# Patient Record
Sex: Male | Born: 1995 | Race: White | Hispanic: No | Marital: Single | Smoking: Former smoker
Health system: Southern US, Community
[De-identification: ages and names within clinical notes are randomized; demographics above are authoritative.]

## PROBLEM LIST (undated history)

## (undated) ENCOUNTER — Emergency Department (HOSPITAL_COMMUNITY): Disposition: A | Discharge status: Home / Self Care | Payer: Self-pay

## (undated) DIAGNOSIS — R42 Dizziness and giddiness: Secondary | ICD-10-CM

## (undated) HISTORY — DX: Dizziness and giddiness: R42

## (undated) HISTORY — PX: NO PAST SURGERIES: SHX2092

---
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
## 2013-07-13 ENCOUNTER — Ambulatory Visit (INDEPENDENT_AMBULATORY_CARE_PROVIDER_SITE_OTHER): Payer: BC Managed Care – PPO | Admitting: Physician Assistant

## 2013-07-13 VITALS — BP 100/70 | HR 77 | Temp 97.7°F | Resp 16 | Ht 68.0 in | Wt 154.0 lb

## 2013-07-13 DIAGNOSIS — Z1329 Encounter for screening for other suspected endocrine disorder: Secondary | ICD-10-CM

## 2013-07-13 DIAGNOSIS — Z1321 Encounter for screening for nutritional disorder: Secondary | ICD-10-CM

## 2013-07-13 DIAGNOSIS — Z130 Encounter for screening for diseases of the blood and blood-forming organs and certain disorders involving the immune mechanism: Secondary | ICD-10-CM

## 2013-07-13 DIAGNOSIS — Z8342 Family history of familial hypercholesterolemia: Secondary | ICD-10-CM

## 2013-07-13 DIAGNOSIS — Z13228 Encounter for screening for other metabolic disorders: Secondary | ICD-10-CM

## 2013-07-13 DIAGNOSIS — Z13220 Encounter for screening for lipoid disorders: Secondary | ICD-10-CM

## 2013-07-13 DIAGNOSIS — Z8349 Family history of other endocrine, nutritional and metabolic diseases: Secondary | ICD-10-CM

## 2013-07-13 LAB — POCT CBC
GRANULOCYTE PERCENT: 51.6 % (ref 37–80)
HEMATOCRIT: 50.1 % (ref 43.5–53.7)
HEMOGLOBIN: 16.4 g/dL (ref 14.1–18.1)
LYMPH, POC: 1.9 (ref 0.6–3.4)
MCH, POC: 30.5 pg (ref 27–31.2)
MCHC: 32.7 g/dL (ref 31.8–35.4)
MCV: 93.3 fL (ref 80–97)
MID (cbc): 0.5 (ref 0–0.9)
MPV: 12.7 fL (ref 0–99.8)
POC GRANULOCYTE: 2.5 (ref 2–6.9)
POC LYMPH %: 38.7 % (ref 10–50)
POC MID %: 9.7 %M (ref 0–12)
Platelet Count, POC: 230 10*3/uL (ref 142–424)
RBC: 5.37 M/uL (ref 4.69–6.13)
RDW, POC: 13.1 %
WBC: 4.9 10*3/uL (ref 4.6–10.2)

## 2013-07-13 LAB — COMPLETE METABOLIC PANEL WITH GFR
ALBUMIN: 4.6 g/dL (ref 3.5–5.2)
ALT: 19 U/L (ref 0–53)
AST: 20 U/L (ref 0–37)
Alkaline Phosphatase: 55 U/L (ref 39–117)
BUN: 11 mg/dL (ref 6–23)
CO2: 27 meq/L (ref 19–32)
Calcium: 9.5 mg/dL (ref 8.4–10.5)
Chloride: 101 mEq/L (ref 96–112)
Creat: 0.8 mg/dL (ref 0.50–1.35)
GLUCOSE: 83 mg/dL (ref 70–99)
POTASSIUM: 4.8 meq/L (ref 3.5–5.3)
SODIUM: 138 meq/L (ref 135–145)
TOTAL PROTEIN: 7.4 g/dL (ref 6.0–8.3)
Total Bilirubin: 0.5 mg/dL (ref 0.2–1.2)

## 2013-07-13 LAB — LIPID PANEL
CHOLESTEROL: 156 mg/dL (ref 0–200)
HDL: 56 mg/dL (ref >39)
LDL Cholesterol: 83 mg/dL (ref 0–99)
Total CHOL/HDL Ratio: 2.8 Ratio
Triglycerides: 85 mg/dL (ref <150)
VLDL: 17 mg/dL (ref 0–40)

## 2013-07-13 LAB — TSH: TSH: 1.339 u[IU]/mL (ref 0.350–4.500)

## 2013-07-13 NOTE — Patient Instructions (Signed)
I will contact you with your lab results as soon as they are available.   If you have not heard from me in 2 weeks, please contact me.  The fastest way to get your results is to register for My Chart (see the instructions on the last page of this printout).   

## 2013-07-13 NOTE — Progress Notes (Signed)
   Subjective:    Patient ID: Johnny Hall, male    DOB: October 18, 1995, 18 y.o.   MRN: [id number]  HPI  Pt presents to clinic for lab work.  He has no concerns but he is home from break from school and his mother would like some screening lab work.  He has always been healthy to the best of his knowledge.  He thinks he might have had some anemia in the past.  Review of Systems  Constitutional: Negative.   HENT: Negative.        Objective:   Physical Exam  Vitals reviewed. Constitutional: He is oriented to person, place, and time. He appears well-developed and well-nourished.  HENT:  Head: Normocephalic and atraumatic.  Right Ear: External ear normal.  Left Ear: External ear normal.  Eyes: Conjunctivae are normal.  Neck: Normal range of motion. No mass and no thyromegaly present.  Cardiovascular: Normal rate, regular rhythm and normal heart sounds.   No murmur heard. Pulmonary/Chest: Effort normal and breath sounds normal. He has no wheezes.  Neurological: He is alert and oriented to person, place, and time.  Skin: Skin is warm and dry.  Psychiatric: He has a normal mood and affect. His behavior is normal. Judgment and thought content normal.   Results for orders placed in visit on 09/09/11  POCT CBC      Result Value Ref Range   WBC 4.6  4.6 - 10.2 K/uL   Lymph, poc 1.7  0.6 - 3.4   POC LYMPH PERCENT 36.9  10 - 50 %L   MID (cbc) 0.6  0 - 0.9   POC MID % 12.4 (*) 0 - 12 %M   POC Granulocyte 2.3  2 - 6.9   Granulocyte percent 50.7  37 - 80 %G   RBC 4.78  4.69 - 6.13 M/uL   Hemoglobin 14.4  14.1 - 18.1 g/dL   HCT, POC 43.5  43.5 - 53.7 %   MCV 91.0  80 - 97 fL   MCH, POC 30.1  27 - 31.2 pg   MCHC 33.1  31.8 - 35.4 g/dL   RDW, POC 12.7     Platelet Count, POC 206  142 - 424 K/uL   MPV 10.6  0 - 99.8 fL       Assessment & Plan:  Screening for thyroid disorder - Plan: TSH  Screening cholesterol level - Plan: COMPLETE METABOLIC PANEL WITH GFR, Lipid panel  Family  history of high cholesterol  Encounter for vitamin deficiency screening - Plan: Vit D  25 hydroxy (rtn osteoporosis monitoring)  Screening for blood disease - Plan: POCT CBC  We will screen for general labs.   Windell Hummingbird PA-C  [facility] 07/13/2013 10:47 AM

## 2013-07-14 LAB — VITAMIN D 25 HYDROXY (VIT D DEFICIENCY, FRACTURES): VIT D 25 HYDROXY: 21 ng/mL — AB (ref 30–89)

## 2014-06-01 ENCOUNTER — Ambulatory Visit (INDEPENDENT_AMBULATORY_CARE_PROVIDER_SITE_OTHER): Payer: BLUE CROSS/BLUE SHIELD | Admitting: Internal Medicine

## 2014-06-01 ENCOUNTER — Emergency Department (HOSPITAL_COMMUNITY): Payer: BLUE CROSS/BLUE SHIELD

## 2014-06-01 ENCOUNTER — Emergency Department (HOSPITAL_COMMUNITY)
Admission: EM | Admit: 2014-06-01 | Discharge: 2014-06-01 | Disposition: A | Discharge status: Home / Self Care | Payer: BLUE CROSS/BLUE SHIELD | Attending: Emergency Medicine | Admitting: Emergency Medicine

## 2014-06-01 VITALS — BP 119/68 | HR 108 | Temp 99.4°F | Resp 18

## 2014-06-01 DIAGNOSIS — K529 Noninfective gastroenteritis and colitis, unspecified: Secondary | ICD-10-CM | POA: Insufficient documentation

## 2014-06-01 DIAGNOSIS — E860 Dehydration: Secondary | ICD-10-CM | POA: Diagnosis not present

## 2014-06-01 DIAGNOSIS — R1084 Generalized abdominal pain: Secondary | ICD-10-CM | POA: Diagnosis not present

## 2014-06-01 DIAGNOSIS — R197 Diarrhea, unspecified: Secondary | ICD-10-CM

## 2014-06-01 DIAGNOSIS — R000 Tachycardia, unspecified: Secondary | ICD-10-CM | POA: Diagnosis not present

## 2014-06-01 DIAGNOSIS — R112 Nausea with vomiting, unspecified: Secondary | ICD-10-CM

## 2014-06-01 DIAGNOSIS — R55 Syncope and collapse: Secondary | ICD-10-CM | POA: Diagnosis not present

## 2014-06-01 DIAGNOSIS — R42 Dizziness and giddiness: Secondary | ICD-10-CM | POA: Diagnosis not present

## 2014-06-01 LAB — CBC WITH DIFFERENTIAL/PLATELET
Basophils Absolute: 0 10*3/uL (ref 0.0–0.1)
Basophils Relative: 0 % (ref 0–1)
Eosinophils Absolute: 0 10*3/uL (ref 0.0–0.7)
Eosinophils Relative: 0 % (ref 0–5)
HCT: 39.9 % (ref 39.0–52.0)
Hemoglobin: 13.3 g/dL (ref 13.0–17.0)
Lymphocytes Relative: 5 % — ABNORMAL LOW (ref 12–46)
Lymphs Abs: 0.5 10*3/uL — ABNORMAL LOW (ref 0.7–4.0)
MCH: 30.5 pg (ref 26.0–34.0)
MCHC: 33.3 g/dL (ref 30.0–36.0)
MCV: 91.5 fL (ref 78.0–100.0)
Monocytes Absolute: 0.7 10*3/uL (ref 0.1–1.0)
Monocytes Relative: 7 % (ref 3–12)
Neutro Abs: 9.1 10*3/uL — ABNORMAL HIGH (ref 1.7–7.7)
Neutrophils Relative %: 88 % — ABNORMAL HIGH (ref 43–77)
Platelets: 163 10*3/uL (ref 150–400)
RBC: 4.36 MIL/uL (ref 4.22–5.81)
RDW: 12.1 % (ref 11.5–15.5)
WBC: 10.3 10*3/uL (ref 4.0–10.5)

## 2014-06-01 LAB — POCT URINALYSIS DIPSTICK
BILIRUBIN UA: NEGATIVE
Glucose, UA: NEGATIVE
Ketones, UA: NEGATIVE
LEUKOCYTES UA: NEGATIVE
NITRITE UA: NEGATIVE
Protein, UA: NEGATIVE
SPEC GRAV UA: 1.02
Urobilinogen, UA: 0.2
pH, UA: 6.5

## 2014-06-01 LAB — POCT CBC
Granulocyte percent: 94.1 %G — AB (ref 37–80)
Granulocyte percent: 93.1 %G — AB (ref 37–80)
HCT, POC: 48.5 % (ref 43.5–53.7)
HEMATOCRIT: 51.8 % (ref 43.5–53.7)
HEMOGLOBIN: 17.3 g/dL (ref 14.1–18.1)
HEMOGLOBIN: 15.8 g/dL (ref 14.1–18.1)
LYMPH, POC: 0.7 (ref 0.6–3.4)
Lymph, poc: 0.7 (ref 0.6–3.4)
MCH, POC: 30.4 pg (ref 27–31.2)
MCH: 29.8 pg (ref 27–31.2)
MCHC: 33.3 g/dL (ref 31.8–35.4)
MCHC: 32.6 g/dL (ref 31.8–35.4)
MCV: 91.2 fL (ref 80–97)
MCV: 91.6 fL (ref 80–97)
MID (cbc): 0.2 (ref 0–0.9)
MID (cbc): 0.4 (ref 0–0.9)
MPV: 9.6 fL (ref 0–99.8)
MPV: 8.9 fL (ref 0–99.8)
POC GRANULOCYTE: 14.7 — AB (ref 2–6.9)
POC GRANULOCYTE: 14.9 — AB (ref 2–6.9)
POC LYMPH %: 4.7 % — AB (ref 10–50)
POC LYMPH %: 4.3 % — AB (ref 10–50)
POC MID %: 1.2 %M (ref 0–12)
POC MID %: 2.6 %M (ref 0–12)
Platelet Count, POC: 174 10*3/uL (ref 142–424)
Platelet Count, POC: 175 10*3/uL (ref 142–424)
RBC: 5.68 M/uL (ref 4.69–6.13)
RBC: 5.29 M/uL (ref 4.69–6.13)
RDW, POC: 13 %
RDW, POC: 12.6 %
WBC: 15.6 10*3/uL — AB (ref 4.6–10.2)
WBC: 16 10*3/uL — AB (ref 4.6–10.2)

## 2014-06-01 LAB — POCT UA - MICROSCOPIC ONLY
Casts, Ur, LPF, POC: NEGATIVE
Crystals, Ur, HPF, POC: NEGATIVE
MUCUS UA: NEGATIVE
RBC, urine, microscopic: NEGATIVE
WBC, Ur, HPF, POC: NEGATIVE
Yeast, UA: NEGATIVE

## 2014-06-01 LAB — COMPREHENSIVE METABOLIC PANEL
ALK PHOS: 59 U/L (ref 39–117)
ALT: 13 U/L (ref 0–53)
ALT: 8 U/L (ref 0–53)
AST: 16 U/L (ref 0–37)
AST: 26 U/L (ref 0–37)
Albumin: 5.1 g/dL (ref 3.5–5.2)
Albumin: 4 g/dL (ref 3.5–5.2)
Alkaline Phosphatase: 42 U/L (ref 39–117)
Anion gap: 8 (ref 5–15)
BUN: 12 mg/dL (ref 6–23)
BUN: 11 mg/dL (ref 6–23)
CHLORIDE: 100 meq/L (ref 96–112)
CO2: 25 mEq/L (ref 19–32)
CO2: 23 mmol/L (ref 19–32)
Calcium: 10.3 mg/dL (ref 8.4–10.5)
Calcium: 8.1 mg/dL — ABNORMAL LOW (ref 8.4–10.5)
Chloride: 110 mmol/L (ref 96–112)
Creat: 1.07 mg/dL (ref 0.50–1.35)
Creatinine, Ser: 0.94 mg/dL (ref 0.50–1.35)
GFR calc Af Amer: >90 mL/min (ref >90)
GFR calc non Af Amer: >90 mL/min (ref >90)
Glucose, Bld: 105 mg/dL — ABNORMAL HIGH (ref 70–99)
Glucose, Bld: 96 mg/dL (ref 70–99)
Potassium: 5.1 mEq/L (ref 3.5–5.3)
Potassium: 4 mmol/L (ref 3.5–5.1)
Sodium: 138 mEq/L (ref 135–145)
Sodium: 141 mmol/L (ref 135–145)
TOTAL PROTEIN: 7.9 g/dL (ref 6.0–8.3)
Total Bilirubin: 1.2 mg/dL (ref 0.2–1.2)
Total Bilirubin: 1.1 mg/dL (ref 0.3–1.2)
Total Protein: 6.5 g/dL (ref 6.0–8.3)

## 2014-06-01 LAB — I-STAT CG4 LACTIC ACID, ED
Lactic Acid, Venous: 1.93 mmol/L (ref 0.5–2.0)
Lactic Acid, Venous: 0.84 mmol/L (ref 0.5–2.0)

## 2014-06-01 LAB — GLUCOSE, POCT (MANUAL RESULT ENTRY): POC GLUCOSE: 102 mg/dL — AB (ref 70–99)

## 2014-06-01 LAB — LIPASE, BLOOD: Lipase: 25 U/L (ref 11–59)

## 2014-06-01 MED ORDER — IOHEXOL 300 MG/ML  SOLN
100.0000 mL | Freq: Once | INTRAMUSCULAR | Status: AC | PRN
  Administered 2014-06-01: 100 mL via INTRAVENOUS

## 2014-06-01 MED ORDER — SODIUM CHLORIDE 0.9 % IV BOLUS (SEPSIS)
1000.0000 mL | INTRAVENOUS | Status: AC
  Administered 2014-06-01: 1000 mL via INTRAVENOUS

## 2014-06-01 MED ORDER — ONDANSETRON HCL 4 MG/2ML IJ SOLN
4.0000 mg | INTRAMUSCULAR | Status: AC
  Administered 2014-06-01: 4 mg via INTRAVENOUS
  Filled 2014-06-01: qty 2

## 2014-06-01 MED ORDER — ONDANSETRON 4 MG PO TBDP
8.0000 mg | ORAL_TABLET | Freq: Once | ORAL | Status: AC
  Administered 2014-06-01: 8 mg via ORAL

## 2014-06-01 MED ORDER — CEFTRIAXONE SODIUM 1 G IJ SOLR
1.0000 g | Freq: Once | INTRAMUSCULAR | Status: AC
  Administered 2014-06-01: 1 g via INTRAMUSCULAR

## 2014-06-01 MED ORDER — SODIUM CHLORIDE 0.9 % IV BOLUS (SEPSIS)
1000.0000 mL | Freq: Once | INTRAVENOUS | Status: AC
  Administered 2014-06-01: 1000 mL via INTRAVENOUS

## 2014-06-01 MED ORDER — ONDANSETRON HCL 4 MG PO TABS: 4.0000 mg | ORAL_TABLET | Freq: Four times a day (QID) | ORAL | Status: DC

## 2014-06-01 MED ORDER — KETOROLAC TROMETHAMINE 30 MG/ML IJ SOLN
30.0000 mg | Freq: Once | INTRAMUSCULAR | Status: AC
  Administered 2014-06-01: 30 mg via INTRAVENOUS
  Filled 2014-06-01: qty 1

## 2014-06-01 MED ORDER — IOHEXOL 300 MG/ML  SOLN
25.0000 mL | Freq: Once | INTRAMUSCULAR | Status: AC | PRN
  Administered 2014-06-01: 25 mL via ORAL

## 2014-06-01 NOTE — ED Provider Notes (Signed)
CSN: [id number]     Arrival date & time 06/01/14  1622 History   First MD Initiated Contact with Patient 06/01/14 1708     Chief Complaint  Patient presents with  . infection/ sent from urgent care    (Consider location/radiation/quality/duration/timing/severity/associated sxs/prior Treatment) HPI Johnny Hall is a 19 yo male presenting with report of diarrhea and vomiting.  He states he woke up this morning with and had multiple episodes of diarrhea.  He attempted to drink some hot tea and then he felt nauseated and vomited.  He reports 15-20 episodes of diarrhea and multiple episodes of vomiting.  He went to the Urgent Care and had a fever and was given 3 liters of NS, IM rocephin and zofran.  He currently complains of general muscle aches.  He denies testicular pain. He denies bloody or bilious emesis or dark or bloody stools.     No past medical history on file. No past surgical history on file. Family History  Problem Relation Age of Onset  . Hyperlipidemia Mother   . Hypertension Mother    History  Substance Use Topics  . Smoking status: Former Research scientist (life sciences)  . Smokeless tobacco: Not on file  . Alcohol Use: No    Review of Systems  Constitutional: Positive for fever. Negative for chills.  HENT: Negative for sore throat.   Eyes: Negative for visual disturbance.  Respiratory: Negative for cough and shortness of breath.   Cardiovascular: Negative for chest pain and leg swelling.  Gastrointestinal: Positive for nausea, vomiting, abdominal pain and diarrhea.  Genitourinary: Negative for dysuria.  Musculoskeletal: Negative for myalgias.  Skin: Negative for rash.  Neurological: Negative for weakness, numbness and headaches.      Allergies  Review of patient's allergies indicates no known allergies.  Home Medications   Prior to Admission medications   Medication Sig Start Date End Date Taking? Authorizing Provider  acetaminophen (TYLENOL) 500 MG tablet Take 1,000 mg by mouth  every 6 (six) hours as needed for moderate pain.   Yes Historical Provider, MD  acetaminophen-codeine 120-12 MG/5ML solution Take 5 mLs by mouth every 4 (four) hours as needed for moderate pain.   Yes Historical Provider, MD  cefTRIAXone (ROCEPHIN) 1 G injection Inject 1 g into the muscle once.   Yes Historical Provider, MD  ondansetron (ZOFRAN-ODT) 8 MG disintegrating tablet Take 16 mg by mouth every 8 (eight) hours as needed for nausea or vomiting.   Yes Historical Provider, MD   BP 115/57 mmHg  Pulse 107  Temp(Src) 98.4 F (36.9 C) (Oral)  Resp 16  SpO2 97% Physical Exam  Constitutional: He is oriented to person, place, and time. He appears well-developed and well-nourished. No distress.  HENT:  Head: Normocephalic and atraumatic.  Mouth/Throat: Oropharynx is clear and moist. No oropharyngeal exudate.  Eyes: Conjunctivae are normal.  Neck: Neck supple. No thyromegaly present.  Cardiovascular: Regular rhythm and intact distal pulses.  Tachycardia present.   Pulmonary/Chest: Effort normal and breath sounds normal. No respiratory distress. He has no wheezes. He has no rales. He exhibits no tenderness.  Abdominal: Soft. He exhibits no distension and no mass. There is no hepatosplenomegaly. There is generalized tenderness. There is no rigidity, no rebound, no guarding, no CVA tenderness, no tenderness at McBurney's point and negative Murphy's sign.    Generally tender, slightly more TTP over LLQ  Musculoskeletal: He exhibits no tenderness.  Lymphadenopathy:    He has no cervical adenopathy.  Neurological: He is alert and oriented to person,  place, and time.  Skin: Skin is warm. No rash noted. He is diaphoretic.  Psychiatric: He has a normal mood and affect.  Nursing note and vitals reviewed.   ED Course  Procedures (including critical care time) Labs Review Labs Reviewed  COMPREHENSIVE METABOLIC PANEL - Abnormal; Notable for the following:    Calcium 8.1 (*)    All other  components within normal limits  CBC WITH DIFFERENTIAL/PLATELET - Abnormal; Notable for the following:    Neutrophils Relative % 88 (*)    Neutro Abs 9.1 (*)    Lymphocytes Relative 5 (*)    Lymphs Abs 0.5 (*)    All other components within normal limits  LIPASE, BLOOD  CBC WITH DIFFERENTIAL/PLATELET  I-STAT CG4 LACTIC ACID, ED  I-STAT CG4 LACTIC ACID, ED  I-STAT CG4 LACTIC ACID, ED    Imaging Review Ct Abdomen Pelvis W Contrast  06/01/2014   CLINICAL DATA:  Lower abdominal pain for 2 days. Nausea and vomiting. Low-grade fever.  EXAM: CT ABDOMEN AND PELVIS WITH CONTRAST  TECHNIQUE: Multidetector CT imaging of the abdomen and pelvis was performed using the standard protocol following bolus administration of intravenous contrast.  CONTRAST:  66mL OMNIPAQUE IOHEXOL 300 MG/ML SOLN, 140mL OMNIPAQUE IOHEXOL 300 MG/ML SOLN  COMPARISON:  None.  FINDINGS: Lower chest:  Unremarkable  Hepatobiliary: Unremarkable  Pancreas: Unremarkable  Spleen: Unremarkable  Adrenals/Urinary Tract: Unremarkable  Stomach/Bowel: Air-fluid levels in the distal colon and rectum. No dilated bowel. Appendix unremarkable.  Vascular/Lymphatic: Unremarkable  Reproductive: Unremarkable  Other: No supplemental non-categorized findings.  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 Air-fluid levels in the distal colon and rectum favoring diarrheal process.   Electronically Signed   By: Van Clines M.D.   On: 06/01/2014 22:31     EKG Interpretation None      MDM   Final diagnoses:  Gastroenteritis    19 yo sent from Urgent Care for further eval of n/v/d. He had elevated WBC to 16. Pt received 3 L NS and rocephin prior to arrival. On arrival to the ED he remains tachycardic to 120 with general abd pain, slightly more tender on LLQ. Consider viral gastroenteritis vs colitis. NS bolus, zofran, toradol,  Labs drawn from triage include:CBC, CMP, Lipase, Lactic acid.  Discussed  case with Dr. Alvino Chapel. Pt remains tachycardic to 115 after 2 liters and on repeat abd exam still generally tender slightly more tender in LLQ. CT abd/pelvis done due to clinical appearance and family concern. CT result unremarkable for significant abnormality. Pt reports feeling better after and can drink fluids without difficulty. Discussed symptom management and oral re-hydration. Pt is well-appearing, in no acute distress and vital signs reviewed and not concerning. He appears safe to be discharged.  Discharge include follow-up with his PCP.  Return precautions provided. Pt and family aware of plan and in agreement.     Filed Vitals:   06/01/14 1905 06/01/14 2109 06/01/14 2234 06/01/14 2320  BP: 115/57 114/60 124/69   Pulse: 107 104 105   Temp: 98.4 F (36.9 C)   99.3 F (37.4 C)  TempSrc: Oral   Oral  Resp: 16 12 17    SpO2: 97% 97% 98%    Meds given in ED:  Medications  sodium chloride 0.9 % bolus 1,000 mL (0 mLs Intravenous Stopped 06/01/14 1945)  sodium chloride 0.9 % bolus 1,000 mL (0 mLs Intravenous Stopped 06/01/14 2041)  ketorolac (TORADOL) 30 MG/ML injection 30 mg (30 mg Intravenous Given  06/01/14 1946)  ondansetron (ZOFRAN) injection 4 mg (4 mg Intravenous Given 06/01/14 1946)  iohexol (OMNIPAQUE) 300 MG/ML solution 100 mL (100 mLs Intravenous Contrast Given 06/01/14 2209)  iohexol (OMNIPAQUE) 300 MG/ML solution 25 mL (25 mLs Oral Contrast Given 06/01/14 2209)  ondansetron (ZOFRAN) injection 4 mg (4 mg Intravenous Given 06/01/14 2313)    Discharge Medication List as of 06/01/2014 11:25 PM     06/01/14 0000  ondansetron (ZOFRAN) 4 MG tablet Every 6 hours Discontinue Reprint 06/01/14 2327          Britt Bottom, NP 06/02/14 [place]. Alvino Chapel, MD 06/02/14 1446

## 2014-06-01 NOTE — Progress Notes (Signed)
2 liters NS IV completed and 3rd liter started at 3 pm.

## 2014-06-01 NOTE — ED Notes (Signed)
Per ems pt from [facility], pt was seen for N/V/ severe diarrhea and fever. Pt received Rocephin 1 g IM , and zofran SL. Also received tylenol 3 IV .

## 2014-06-01 NOTE — Patient Instructions (Addendum)
Food Choices to Help Relieve Diarrhea When you have diarrhea, the foods you eat and your eating habits are very important. Choosing the right foods and drinks can help relieve diarrhea. Also, because diarrhea can last up to 7 days, you need to replace lost fluids and electrolytes (such as sodium, potassium, and chloride) in order to help prevent dehydration.  WHAT GENERAL GUIDELINES DO I NEED TO FOLLOW?  Slowly drink 1 cup (8 oz) of fluid for each episode of diarrhea. If you are getting enough fluid, your urine will be clear or pale yellow.  Eat starchy foods. Some good choices include white rice, white toast, pasta, low-fiber cereal, baked potatoes (without the skin), saltine crackers, and bagels.  Avoid large servings of any cooked vegetables.  Limit fruit to two servings per day. A serving is  cup or 1 small piece.  Choose foods with less than 2 g of fiber per serving.  Limit fats to less than 8 tsp (38 g) per day.  Avoid fried foods.  Eat foods that have probiotics in them. Probiotics can be found in certain dairy products.  Avoid foods and beverages that may increase the speed at which food moves through the stomach and intestines (gastrointestinal tract). Things to avoid include:  High-fiber foods, such as dried fruit, raw fruits and vegetables, nuts, seeds, and whole grain foods.  Spicy foods and high-fat foods.  Foods and beverages sweetened with high-fructose corn syrup, honey, or sugar alcohols such as xylitol, sorbitol, and mannitol. WHAT FOODS ARE RECOMMENDED? Grains White rice. White, French, or pita breads (fresh or toasted), including plain rolls, buns, or bagels. White pasta. Saltine, soda, or graham crackers. Pretzels. Low-fiber cereal. Cooked cereals made with water (such as cornmeal, farina, or cream cereals). Plain muffins. Matzo. Melba toast. Zwieback.  Vegetables Potatoes (without the skin). Strained tomato and vegetable juices. Most well-cooked and canned  vegetables without seeds. Tender lettuce. Fruits Cooked or canned applesauce, apricots, cherries, fruit cocktail, grapefruit, peaches, pears, or plums. Fresh bananas, apples without skin, cherries, grapes, cantaloupe, grapefruit, peaches, oranges, or plums.  Meat and Other Protein Products Baked or boiled chicken. Eggs. Tofu. Fish. Seafood. Smooth peanut butter. Ground or well-cooked tender beef, ham, veal, lamb, pork, or poultry.  Dairy Plain yogurt, kefir, and unsweetened liquid yogurt. Lactose-free milk, buttermilk, or soy milk. Plain hard cheese. Beverages Sport drinks. Clear broths. Diluted fruit juices (except prune). Regular, caffeine-free sodas such as ginger ale. Water. Decaffeinated teas. Oral rehydration solutions. Sugar-free beverages not sweetened with sugar alcohols. Other Bouillon, broth, or soups made from recommended foods.  The items listed above may not be a complete list of recommended foods or beverages. Contact your dietitian for more options. WHAT FOODS ARE NOT RECOMMENDED? Grains Whole grain, whole wheat, bran, or rye breads, rolls, pastas, crackers, and cereals. Wild or brown rice. Cereals that contain more than 2 g of fiber per serving. Corn tortillas or taco shells. Cooked or dry oatmeal. Granola. Popcorn. Vegetables Raw vegetables. Cabbage, broccoli, Brussels sprouts, artichokes, baked beans, beet greens, corn, kale, legumes, peas, sweet potatoes, and yams. Potato skins. Cooked spinach and cabbage. Fruits Dried fruit, including raisins and dates. Raw fruits. Stewed or dried prunes. Fresh apples with skin, apricots, mangoes, pears, raspberries, and strawberries.  Meat and Other Protein Products Chunky peanut butter. Nuts and seeds. Beans and lentils. Bacon.  Dairy High-fat cheeses. Milk, chocolate milk, and beverages made with milk, such as milk shakes. Cream. Ice cream. Sweets and Desserts Sweet rolls, doughnuts, and sweet breads. Pancakes   and waffles. Fats and  Oils Butter. Cream sauces. Margarine. Salad oils. Plain salad dressings. Olives. Avocados.  Beverages Caffeinated beverages (such as coffee, tea, soda, or energy drinks). Alcoholic beverages. Fruit juices with pulp. Prune juice. Soft drinks sweetened with high-fructose corn syrup or sugar alcohols. Other Coconut. Hot sauce. Chili powder. Mayonnaise. Gravy. Cream-based or milk-based soups.  The items listed above may not be a complete list of foods and beverages to avoid. Contact your dietitian for more information. WHAT SHOULD I DO IF I BECOME DEHYDRATED? Diarrhea can sometimes lead to dehydration. Signs of dehydration include dark urine and dry mouth and skin. If you think you are dehydrated, you should rehydrate with an oral rehydration solution. These solutions can be purchased at pharmacies, retail stores, or online.  Drink -1 cup (120-240 mL) of oral rehydration solution each time you have an episode of diarrhea. If drinking this amount makes your diarrhea worse, try drinking smaller amounts more often. For example, drink 1-3 tsp (5-15 mL) every 5-10 minutes.  A general rule for staying hydrated is to drink 1-2 L of fluid per day. Talk to your health care provider about the specific amount you should be drinking each day. Drink enough fluids to keep your urine clear or pale yellow. Document Released: 06/17/2003 Document Revised: 04/01/2013 Document Reviewed: 02/17/2013 [facility] Patient Information 2015 [place], [place]. This information is not intended to replace advice given to you by your health care provider. Make sure you discuss any questions you have with your health care provider. Viral Gastroenteritis Viral gastroenteritis is also known as stomach flu. This condition affects the stomach and intestinal tract. It can cause sudden diarrhea and vomiting. The illness typically lasts 3 to 8 days. Most people develop an immune response that eventually gets rid of the virus. While this natural  response develops, the virus can make you quite ill. CAUSES  Many different viruses can cause gastroenteritis, such as rotavirus or noroviruses. You can catch one of these viruses by consuming contaminated food or water. You may also catch a virus by sharing utensils or other personal items with an infected person or by touching a contaminated surface. SYMPTOMS  The most common symptoms are diarrhea and vomiting. These problems can cause a severe loss of body fluids (dehydration) and a body salt (electrolyte) imbalance. Other symptoms may include:  Fever.  Headache.  Fatigue.  Abdominal pain. DIAGNOSIS  Your caregiver can usually diagnose viral gastroenteritis based on your symptoms and a physical exam. A stool sample may also be taken to test for the presence of viruses or other infections. TREATMENT  This illness typically goes away on its own. Treatments are aimed at rehydration. The most serious cases of viral gastroenteritis involve vomiting so severely that you are not able to keep fluids down. In these cases, fluids must be given through an intravenous line (IV). HOME CARE INSTRUCTIONS   Drink enough fluids to keep your urine clear or pale yellow. Drink small amounts of fluids frequently and increase the amounts as tolerated.  Ask your caregiver for specific rehydration instructions.  Avoid:  Foods high in sugar.  Alcohol.  Carbonated drinks.  Tobacco.  Juice.  Caffeine drinks.  Extremely hot or cold fluids.  Fatty, greasy foods.  Too much intake of anything at one time.  Dairy products until 24 to 48 hours after diarrhea stops.  You may consume probiotics. Probiotics are active cultures of beneficial bacteria. They may lessen the amount and number of diarrheal stools in adults. Probiotics  can be found in yogurt with active cultures and in supplements.  Wash your hands well to avoid spreading the virus.  Only take over-the-counter or prescription medicines for  pain, discomfort, or fever as directed by your caregiver. Do not give aspirin to children. Antidiarrheal medicines are not recommended.  Ask your caregiver if you should continue to take your regular prescribed and over-the-counter medicines.  Keep all follow-up appointments as directed by your caregiver. SEEK IMMEDIATE MEDICAL CARE IF:   You are unable to keep fluids down.  You do not urinate at least once every 6 to 8 hours.  You develop shortness of breath.  You notice blood in your stool or vomit. This may look like coffee grounds.  You have abdominal pain that increases or is concentrated in one small area (localized).  You have persistent vomiting or diarrhea.  You have a fever.  The patient is a child younger than 3 months, and he or she has a fever.  The patient is a child older than 3 months, and he or she has a fever and persistent symptoms.  The patient is a child older than 3 months, and he or she has a fever and symptoms suddenly get worse.  The patient is a baby, and he or she has no tears when crying. MAKE SURE YOU:   Understand these instructions.  Will watch your condition.  Will get help right away if you are not doing well or get worse. Document Released: 03/27/2005 Document Revised: 06/19/2011 Document Reviewed: 01/11/2011 [facility] Patient Information 2015 [place], [place]. This information is not intended to replace advice given to you by your health care provider. Make sure you discuss any questions you have with your health care provider. Dehydration, Adult Dehydration is when you lose more fluids from the body than you take in. Vital organs like the kidneys, brain, and heart cannot function without a proper amount of fluids and salt. Any loss of fluids from the body can cause dehydration.  CAUSES   Vomiting.  Diarrhea.  Excessive sweating.  Excessive urine output.  Fever. SYMPTOMS  Mild dehydration  Thirst.  Dry lips.  Slightly dry  mouth. Moderate dehydration  Very dry mouth.  Sunken eyes.  Skin does not bounce back quickly when lightly pinched and released.  Dark urine and decreased urine production.  Decreased tear production.  Headache. Severe dehydration  Very dry mouth.  Extreme thirst.  Rapid, weak pulse (more than 100 beats per minute at rest).  Cold hands and feet.  Not able to sweat in spite of heat and temperature.  Rapid breathing.  Blue lips.  Confusion and lethargy.  Difficulty being awakened.  Minimal urine production.  No tears. DIAGNOSIS  Your caregiver will diagnose dehydration based on your symptoms and your exam. Blood and urine tests will help confirm the diagnosis. The diagnostic evaluation should also identify the cause of dehydration. TREATMENT  Treatment of mild or moderate dehydration can often be done at home by increasing the amount of fluids that you drink. It is best to drink small amounts of fluid more often. Drinking too much at one time can make vomiting worse. Refer to the home care instructions below. Severe dehydration needs to be treated at the hospital where you will probably be given intravenous (IV) fluids that contain water and electrolytes. HOME CARE INSTRUCTIONS   Ask your caregiver about specific rehydration instructions.  Drink enough fluids to keep your urine clear or pale yellow.  Drink small amounts frequently if  you have nausea and vomiting.  Eat as you normally do.  Avoid:  Foods or drinks high in sugar.  Carbonated drinks.  Juice.  Extremely hot or cold fluids.  Drinks with caffeine.  Fatty, greasy foods.  Alcohol.  Tobacco.  Overeating.  Gelatin desserts.  Wash your hands well to avoid spreading bacteria and viruses.  Only take over-the-counter or prescription medicines for pain, discomfort, or fever as directed by your caregiver.  Ask your caregiver if you should continue all prescribed and over-the-counter  medicines.  Keep all follow-up appointments with your caregiver. SEEK MEDICAL CARE IF:  You have abdominal pain and it increases or stays in one area (localizes).  You have a rash, stiff neck, or severe headache.  You are irritable, sleepy, or difficult to awaken.  You are weak, dizzy, or extremely thirsty. SEEK IMMEDIATE MEDICAL CARE IF:   You are unable to keep fluids down or you get worse despite treatment.  You have frequent episodes of vomiting or diarrhea.  You have blood or green matter (bile) in your vomit.  You have blood in your stool or your stool looks black and tarry.  You have not urinated in 6 to 8 hours, or you have only urinated a small amount of very dark urine.  You have a fever.  You faint. MAKE SURE YOU:   Understand these instructions.  Will watch your condition.  Will get help right away if you are not doing well or get worse. Document Released: 03/27/2005 Document Revised: 06/19/2011 Document Reviewed: 11/14/2010 [facility] Patient Information 2015 [place], [place]. This information is not intended to replace advice given to you by your health care provider. Make sure you discuss any questions you have with your health care provider. Nausea and Vomiting Nausea is a sick feeling that often comes before throwing up (vomiting). Vomiting is a reflex where stomach contents come out of your mouth. Vomiting can cause severe loss of body fluids (dehydration). Children and elderly adults can become dehydrated quickly, especially if they also have diarrhea. Nausea and vomiting are symptoms of a condition or disease. It is important to find the cause of your symptoms. CAUSES   Direct irritation of the stomach lining. This irritation can result from increased acid production (gastroesophageal reflux disease), infection, food poisoning, taking certain medicines (such as nonsteroidal anti-inflammatory drugs), alcohol use, or tobacco use.  Signals from the brain.These  signals could be caused by a headache, heat exposure, an inner ear disturbance, increased pressure in the brain from injury, infection, a tumor, or a concussion, pain, emotional stimulus, or metabolic problems.  An obstruction in the gastrointestinal tract (bowel obstruction).  Illnesses such as diabetes, hepatitis, gallbladder problems, appendicitis, kidney problems, cancer, sepsis, atypical symptoms of a heart attack, or eating disorders.  Medical treatments such as chemotherapy and radiation.  Receiving medicine that makes you sleep (general anesthetic) during surgery. DIAGNOSIS Your caregiver may ask for tests to be done if the problems do not improve after a few days. Tests may also be done if symptoms are severe or if the reason for the nausea and vomiting is not clear. Tests may include:  Urine tests.  Blood tests.  Stool tests.  Cultures (to look for evidence of infection).  X-rays or other imaging studies. Test results can help your caregiver make decisions about treatment or the need for additional tests. TREATMENT You need to stay well hydrated. Drink frequently but in small amounts.You may wish to drink water, sports drinks, clear broth, or eat  frozen ice pops or gelatin dessert to help stay hydrated.When you eat, eating slowly may help prevent nausea.There are also some antinausea medicines that may help prevent nausea. HOME CARE INSTRUCTIONS   Take all medicine as directed by your caregiver.  If you do not have an appetite, do not force yourself to eat. However, you must continue to drink fluids.  If you have an appetite, eat a normal diet unless your caregiver tells you differently.  Eat a variety of complex carbohydrates (rice, wheat, potatoes, bread), lean meats, yogurt, fruits, and vegetables.  Avoid high-fat foods because they are more difficult to digest.  Drink enough water and fluids to keep your urine clear or pale yellow.  If you are dehydrated, ask  your caregiver for specific rehydration instructions. Signs of dehydration may include:  Severe thirst.  Dry lips and mouth.  Dizziness.  Dark urine.  Decreasing urine frequency and amount.  Confusion.  Rapid breathing or pulse. SEEK IMMEDIATE MEDICAL CARE IF:   You have blood or brown flecks (like coffee grounds) in your vomit.  You have black or bloody stools.  You have a severe headache or stiff neck.  You are confused.  You have severe abdominal pain.  You have chest pain or trouble breathing.  You do not urinate at least once every 8 hours.  You develop cold or clammy skin.  You continue to vomit for longer than 24 to 48 hours.  You have a fever. MAKE SURE YOU:   Understand these instructions.  Will watch your condition.  Will get help right away if you are not doing well or get worse. Document Released: 03/27/2005 Document Revised: 06/19/2011 Document Reviewed: 08/24/2010 [facility] Patient Information 2015 [place], [place]. This information is not intended to replace advice given to you by your health care provider. Make sure you discuss any questions you have with your health care provider. Bacteremia Bacteremia occurs when bacteria get in your blood. Normal blood does not usually have bacteria. Bacteremia is one way infections can spread from one part of the body to another. CAUSES   Causes may include anything that allows bacteria to get into the body. Examples are:  Catheters.  Intravenous (IV) access tubes.  Cuts or scrapes of the skin.  Temporary bacteremia may occur during dental procedures, while brushing your teeth, or during a bowel movement. This rarely causes any symptoms or medical problems.  Bacteria may also get in the bloodstream as a complication of a bacterial infection elsewhere. This includes infected wounds and bacterial infections of the:  Lungs (pneumonia).  Kidneys (pyelonephritis).  Intestines (enteritis,  colitis).  Organs in the abdomen (appendicitis, cholecystitis, diverticulitis). SYMPTOMS  The body is usually able to clear small numbers of bacteria out of the blood quickly. Brief bacteremia usually does not cause problems.   Problems can occur if the bacteria start to grow in number or spread to other parts of the body. If the bacteria start growing, you may develop:  Chills.  Fever.  Nausea.  Vomiting.  Sweating.  Lightheadedness and low blood pressure.  Pain.  If bacteria start to grow in the linings around the brain, it is called meningitis. This can cause severe headaches, many other problems, and even death.  If bacteria start to grow in a joint, it causes arthritis with painful joints. If bacteria start to grow in a bone, it is called osteomyelitis.  Bacteria from the blood can also cause sores (abscesses) in many organs, such as the muscle, liver, spleen,  lungs, brain, and kidneys. DIAGNOSIS   This condition is diagnosed by cultures of the blood.  Cultures may also be taken from other parts of the body that are thought to be causing the bacteremia. A small piece of tissue, fluid, or other product of the body is sampled. The sample is then put on a growth plate to see if any bacteria grows.  Other lab tests may be done and the results may be abnormal. TREATMENT  Treatment requires a stay in the hospital. You will be given antibiotic medicine through an IV access tube. PREVENTION  People with an increased risk of developing bacteremia or complications may be given antibiotics before certain procedures. Examples are:  A person with a heart murmur or artificial heart valve, before having his or her teeth cleaned.  Before having a surgical or other invasive procedure.  Before having a bowel procedure. Document Released: 01/08/2006 Document Revised: 06/19/2011 Document Reviewed: 10/20/2010 [facility] Patient Information 2015 [place], [place]. This information is not  intended to replace advice given to you by your health care provider. Make sure you discuss any questions you have with your health care provider.

## 2014-06-01 NOTE — Discharge Instructions (Signed)
Please follow the directions provided.  Be sure to follow-up with your primary care provider to ensure you are getting better.  Take the zofran to help with nausea.  Continue to drink clear fluids to stay well hydrated and advance your diet as you can tolerate.  Don't hesitate to return for any new, worsening or concerning symptoms.     SEEK IMMEDIATE MEDICAL CARE IF:  You are unable to keep fluids down.  You do not urinate at least once every 6 to 8 hours.  You develop shortness of breath.  You notice blood in your stool or vomit. This may look like coffee grounds.  You have abdominal pain that increases or is concentrated in one small area (localized).  You have persistent vomiting or diarrhea.  You have a fever.

## 2014-06-01 NOTE — Progress Notes (Signed)
Subjective:    Patient ID: Johnny Hall, male    DOB: September 29, 1995, 19 y.o.   MRN: [id number]  HPI Near syncope in parking lot, was vomiting out side. Was able to communicate, brought in on wheel chair. Started vomiting and diarrhea early 5-6am. Now back, neck muscles ache, nausea, dizzy. Clearly dehydrated No exposure hx, no bad food, no travel hx, no animals. Mother is sick gi illness also. Also HA, vomiting slowed down . No ilicit drug use. Review of Systems No phx of anything    Objective:   Physical Exam  Constitutional: He is oriented to person, place, and time. He appears well-developed and well-nourished. He appears lethargic. He is cooperative. He is easily aroused. He appears toxic. He appears ill. He appears distressed.  HENT:  Head: Normocephalic and atraumatic.  Right Ear: External ear normal.  Left Ear: External ear normal.  Nose: Nose normal.  Mouth/Throat: Oropharynx is clear and moist.  Tongue is dry  Eyes: Conjunctivae and EOM are normal. Pupils are equal, round, and reactive to light.  Neck: Normal range of motion. Neck supple. No tracheal deviation present. No thyromegaly present.  Cardiovascular: Regular rhythm, normal heart sounds and normal pulses.   No extrasystoles are present. Tachycardia present.  Exam reveals no gallop.   No murmur heard. Pulmonary/Chest: Effort normal and breath sounds normal.  Abdominal: Soft. He exhibits no distension and no mass. There is tenderness. There is no rebound and no guarding.  Musculoskeletal: Normal range of motion.  Neurological: He is oriented to person, place, and time and easily aroused. He has normal strength. He appears lethargic. No cranial nerve deficit or sensory deficit. He displays a negative Romberg sign. Coordination normal. He displays no Babinski's sign on the right side. He displays no Babinski's sign on the left side.  No nuchal signs Brudzinski/kernig negative  Skin: No rash noted.  Psychiatric: He has  a normal mood and affect. His behavior is normal. Judgment and thought content normal.   Results for orders placed or performed in visit on 06/01/14  POCT CBC  Result Value Ref Range   WBC 15.6 (A) 4.6 - 10.2 K/uL   Lymph, poc 0.7 0.6 - 3.4   POC LYMPH PERCENT 4.7 (A) 10 - 50 %L   MID (cbc) 0.2 0 - 0.9   POC MID % 1.2 0 - 12 %M   POC Granulocyte 14.7 (A) 2 - 6.9   Granulocyte percent 94.1 (A) 37 - 80 %G   RBC 5.68 4.69 - 6.13 M/uL   Hemoglobin 17.3 14.1 - 18.1 g/dL   HCT, POC 51.8 43.5 - 53.7 %   MCV 91.2 80 - 97 fL   MCH, POC 30.4 27 - 31.2 pg   MCHC 33.3 31.8 - 35.4 g/dL   RDW, POC 13.0 %   Platelet Count, POC 174 142 - 424 K/uL   MPV 9.6 0 - 99.8 fL  POCT glucose (manual entry)  Result Value Ref Range   POC Glucose 102 (A) 70 - 99 mg/dl   Tylenol and zofran odt given   Results for orders placed or performed in visit on 06/01/14  POCT CBC  Result Value Ref Range   WBC 15.6 (A) 4.6 - 10.2 K/uL   Lymph, poc 0.7 0.6 - 3.4   POC LYMPH PERCENT 4.7 (A) 10 - 50 %L   MID (cbc) 0.2 0 - 0.9   POC MID % 1.2 0 - 12 %M   POC Granulocyte 14.7 (A) 2 -  6.9   Granulocyte percent 94.1 (A) 37 - 80 %G   RBC 5.68 4.69 - 6.13 M/uL   Hemoglobin 17.3 14.1 - 18.1 g/dL   HCT, POC 51.8 43.5 - 53.7 %   MCV 91.2 80 - 97 fL   MCH, POC 30.4 27 - 31.2 pg   MCHC 33.3 31.8 - 35.4 g/dL   RDW, POC 13.0 %   Platelet Count, POC 174 142 - 424 K/uL   MPV 9.6 0 - 99.8 fL  POCT glucose (manual entry)  Result Value Ref Range   POC Glucose 102 (A) 70 - 99 mg/dl   Repear VS 140pm   99.4/119/68/108 Stool culture 150pm repeat cbc 215pm fever spike 101.4   Will draw blood cultures x2, urine culture done    Results for orders placed or performed in visit on 06/01/14  POCT CBC  Result Value Ref Range   WBC 15.6 (A) 4.6 - 10.2 K/uL   Lymph, poc 0.7 0.6 - 3.4   POC LYMPH PERCENT 4.7 (A) 10 - 50 %L   MID (cbc) 0.2 0 - 0.9   POC MID % 1.2 0 - 12 %M   POC Granulocyte 14.7 (A) 2 - 6.9   Granulocyte  percent 94.1 (A) 37 - 80 %G   RBC 5.68 4.69 - 6.13 M/uL   Hemoglobin 17.3 14.1 - 18.1 g/dL   HCT, POC 51.8 43.5 - 53.7 %   MCV 91.2 80 - 97 fL   MCH, POC 30.4 27 - 31.2 pg   MCHC 33.3 31.8 - 35.4 g/dL   RDW, POC 13.0 %   Platelet Count, POC 174 142 - 424 K/uL   MPV 9.6 0 - 99.8 fL  POCT glucose (manual entry)  Result Value Ref Range   POC Glucose 102 (A) 70 - 99 mg/dl  POCT urinalysis dipstick  Result Value Ref Range   Color, UA dark yellow    Clarity, UA clear    Glucose, UA neg    Bilirubin, UA neg    Ketones, UA neg    Spec Grav, UA 1.020    Blood, UA trace    pH, UA 6.5    Protein, UA neg    Urobilinogen, UA 0.2    Nitrite, UA neg    Leukocytes, UA Negative   POCT UA - Microscopic Only  Result Value Ref Range   WBC, Ur, HPF, POC neg    RBC, urine, microscopic neg    Bacteria, U Microscopic trace    Mucus, UA neg    Epithelial cells, urine per micros 0-1    Crystals, Ur, HPF, POC neg    Casts, Ur, LPF, POC neg    Yeast, UA neg   POCT CBC  Result Value Ref Range   WBC 16.0 (A) 4.6 - 10.2 K/uL   Lymph, poc 0.7 0.6 - 3.4   POC LYMPH PERCENT 4.3 (A) 10 - 50 %L   MID (cbc) 0.4 0 - 0.9   POC MID % 2.6 0 - 12 %M   POC Granulocyte 14.9 (A) 2 - 6.9   Granulocyte percent 93.1 (A) 37 - 80 %G   RBC 5.29 4.69 - 6.13 M/uL   Hemoglobin 15.8 14.1 - 18.1 g/dL   HCT, POC 48.5 43.5 - 53.7 %   MCV 91.6 80 - 97 fL   MCH, POC 29.8 27 - 31.2 pg   MCHC 32.6 31.8 - 35.4 g/dL   RDW, POC 12.6 %   Platelet Count, POC  175 142 - 424 K/uL   MPV 8.9 0 - 99.8 fL  Feeling worse/Fever HA progressive Blood cultures and urine culture collected 2nd liter IV fluid almost in. 3rd started Tylenol #3 10cc po for pain 3pm Rocephin 1g ordered  No improvement/Cannot walk   Assessment & Plan:  Fever/vomiting/dehydration/diarrhea/HA Rocephin 1g To ER via EMT

## 2014-06-01 NOTE — ED Notes (Signed)
Bed: WA22 Expected date:  Expected time:  Means of arrival:  Comments: EMS-fever 

## 2014-06-03 LAB — URINE CULTURE
COLONY COUNT: NO GROWTH
Organism ID, Bacteria: NO GROWTH

## 2014-06-05 LAB — STOOL CULTURE

## 2014-06-07 LAB — CULTURE, BLOOD (SINGLE)
ORGANISM ID, BACTERIA: NO GROWTH
ORGANISM ID, BACTERIA: NO GROWTH

## 2014-06-13 ENCOUNTER — Ambulatory Visit (INDEPENDENT_AMBULATORY_CARE_PROVIDER_SITE_OTHER): Payer: BLUE CROSS/BLUE SHIELD | Admitting: Family Medicine

## 2014-06-13 VITALS — BP 100/78 | HR 76 | Temp 97.5°F | Ht 68.25 in | Wt 144.4 lb

## 2014-06-13 DIAGNOSIS — D229 Melanocytic nevi, unspecified: Secondary | ICD-10-CM | POA: Diagnosis not present

## 2014-06-13 DIAGNOSIS — Z0000 Encounter for general adult medical examination without abnormal findings: Secondary | ICD-10-CM | POA: Diagnosis not present

## 2014-06-13 DIAGNOSIS — Q829 Congenital malformation of skin, unspecified: Secondary | ICD-10-CM

## 2014-06-13 DIAGNOSIS — L858 Other specified epidermal thickening: Secondary | ICD-10-CM

## 2014-06-13 DIAGNOSIS — D72829 Elevated white blood cell count, unspecified: Secondary | ICD-10-CM

## 2014-06-13 DIAGNOSIS — R7989 Other specified abnormal findings of blood chemistry: Secondary | ICD-10-CM

## 2014-06-13 DIAGNOSIS — Z7251 High risk heterosexual behavior: Secondary | ICD-10-CM

## 2014-06-13 DIAGNOSIS — L700 Acne vulgaris: Secondary | ICD-10-CM

## 2014-06-13 DIAGNOSIS — K529 Noninfective gastroenteritis and colitis, unspecified: Secondary | ICD-10-CM | POA: Diagnosis not present

## 2014-06-13 LAB — CBC
HCT: 47.4 % (ref 39.0–52.0)
Hemoglobin: 16 g/dL (ref 13.0–17.0)
MCH: 30.7 pg (ref 26.0–34.0)
MCHC: 33.8 g/dL (ref 30.0–36.0)
MCV: 90.8 fL (ref 78.0–100.0)
MPV: 11.4 fL (ref 8.6–12.4)
Platelets: 338 10*3/uL (ref 150–400)
RBC: 5.22 MIL/uL (ref 4.22–5.81)
RDW: 12.7 % (ref 11.5–15.5)
WBC: 4.3 10*3/uL (ref 4.0–10.5)

## 2014-06-13 LAB — HIV ANTIBODY (ROUTINE TESTING W REFLEX): HIV 1&2 Ab, 4th Generation: NONREACTIVE

## 2014-06-13 LAB — RPR

## 2014-06-13 NOTE — Patient Instructions (Addendum)
We will refer you to dermatologist, but you can try benzoyl peroxide for acne, Neutrogena or Cetaphil cleanser. If patch on arm not improved in next week - return for recheck.  Schedule your eye care provider and dentist visits. You should receive a call or letter about your lab results within the next week to 10 days.     Keeping you healthy  Get these tests  Blood pressure- Have your blood pressure checked once a year by your healthcare provider.  Normal blood pressure is 120/80.  Weight- Have your body mass index (BMI) calculated to screen for obesity.  BMI is a measure of body fat based on height and weight. You can also calculate your own BMI at [email or web address].  Cholesterol- Have your cholesterol checked regularly starting at age 81, sooner may be necessary if you have diabetes, high blood pressure, if a family member developed heart diseases at an early age or if you smoke.   Chlamydia, HIV, and other sexual transmitted disease- Get screened each year until the age of 70 then within three months of each new sexual partner.  Diabetes- Have your blood sugar checked regularly if you have high blood pressure, high cholesterol, a family history of diabetes or if you are overweight.  Get these vaccines  Flu shot- Every fall.  Tetanus shot- Every 10 years.  Menactra- Single dose; prevents meningitis.  Take these steps  Don't smoke- If you do smoke, ask your healthcare provider about quitting. For tips on how to quit, go to www.smokefree.gov or call 1-800-QUIT-NOW.  Be physically active- Exercise 5 days a week for at least 30 minutes.  If you are not already physically active start slow and gradually work up to 30 minutes of moderate physical activity.  Examples of moderate activity include walking briskly, mowing the yard, dancing, swimming bicycling, etc.  Eat a healthy diet- Eat a variety of healthy foods such as fruits, vegetables, low fat milk, low fat cheese, yogurt,  lean meats, poultry, fish, beans, tofu, etc.  For more information on healthy eating, go to www.thenutritionsource.org  Drink alcohol in moderation- Limit alcohol intake two drinks or less a day.  Never drink and drive.  Dentist- Brush and floss teeth twice daily; visit your dentis twice a year.  Depression-Your emotional health is as important as your physical health.  If you're feeling down, losing interest in things you normally enjoy please talk with your healthcare provider.  Gun Safety- If you keep a gun in your home, keep it unloaded and with the safety lock on.  Bullets should be stored separately.  Helmet use- Always wear a helmet when riding a motorcycle, bicycle, rollerblading or skateboarding.  Safe sex- If you may be exposed to a sexually transmitted infection, use a condom  Seat belts- Seat bels can save your life; always wear one.  Smoke/Carbon Monoxide detectors- These detectors need to be installed on the appropriate level of your home.  Replace batteries at least once a year.  Skin Cancer- When out in the sun, cover up and use sunscreen SPF 15 or higher.  Violence- If anyone is threatening or hurting you, please tell your healthcare provider.  Acne Acne is a skin problem that causes pimples. Acne occurs when the pores in your skin get blocked. Your pores may become red, sore, and swollen (inflamed), or infected with a common skin bacterium (Propionibacterium acnes). Acne is a common skin problem. Up to 80% of people get acne at some time. Acne is especially common  from the ages of 27 to 71. Acne usually goes away over time with proper treatment. CAUSES  Your pores each contain an oil gland. The oil glands make an oily substance called sebum. Acne happens when these glands get plugged with sebum, dead skin cells, and dirt. The P. acnes bacteria that are normally found in the oil glands then multiply, causing inflammation. Acne is commonly triggered by changes in your  hormones. These hormonal changes can cause the oil glands to get bigger and to make more sebum. Factors that can make acne worse include:  Hormone changes during adolescence.  Hormone changes during women's menstrual cycles.  Hormone changes during pregnancy.  Oil-based cosmetics and hair products.  Harshly scrubbing the skin.  Strong soaps.  Stress.  Hormone problems due to certain diseases.  Long or oily hair rubbing against the skin.  Certain medicines.  Pressure from headbands, backpacks, or shoulder pads.  Exposure to certain oils and chemicals. SYMPTOMS  Acne often occurs on the face, neck, chest, and upper back. Symptoms include:  Small, red bumps (pimples or papules).  Whiteheads (closed comedones).  Blackheads (open comedones).  Small, pus-filled pimples (pustules).  Big, red pimples or pustules that feel tender. More severe acne can cause:  An infected area that contains a collection of pus (abscess).  Hard, painful, fluid-filled sacs (cysts).  Scars. DIAGNOSIS  Your caregiver can usually tell what the problem is by doing a physical exam. TREATMENT  There are many good treatments for acne. Some are available over the counter and some are available with a prescription. The treatment that is best for you depends on the type of acne you have and how severe it is. It may take 2 months of treatment before your acne gets better. Common treatments include:  Creams and lotions that prevent oil glands from clogging.  Creams and lotions that treat or prevent infections and inflammation.  Antibiotics applied to the skin or taken as a pill.  Pills that decrease sebum production.  Birth control pills.  Light or laser treatments.  Minor surgery.  Injections of medicine into the affected areas.  Chemicals that cause peeling of the skin. HOME CARE INSTRUCTIONS  Good skin care is the most important part of treatment.  Wash your skin gently at least twice  a day and after exercise. Always wash your skin before bed.  Use mild soap.  After each wash, apply a water-based skin moisturizer.  Keep your hair clean and off of your face. Shampoo your hair daily.  Only take medicines as directed by your caregiver.  Use a sunscreen or sunblock with SPF 30 or greater. This is especially important when you are using acne medicines.  Choose cosmetics that are noncomedogenic. This means they do not plug the oil glands.  Avoid leaning your chin or forehead on your hands.  Avoid wearing tight headbands or hats.  Avoid picking or squeezing your pimples. This can make your acne worse and cause scarring. SEEK MEDICAL CARE IF:   Your acne is not better after 8 weeks.  Your acne gets worse.  You have a large area of skin that is red or tender. Document Released: 03/24/2000 Document Revised: 08/11/2013 Document Reviewed: 01/13/2011 [facility] Patient Information 2015 [place], [place]. This information is not intended to replace advice given to you by your health care provider. Make sure you discuss any questions you have with your health care provider.

## 2014-06-13 NOTE — Progress Notes (Signed)
Subjective:  This chart was scribed for Merri Ray, MD by Erling Conte, Medical Scribe. This patient was seen in Room 5 and the patient's care was started at 10:45 AM.   Patient ID: Johnny Hall, male    DOB: 03-Jan-1996, 19 y.o.   MRN: [id number]   Chief Complaint  Patient presents with  . Follow-up    From recent blood infection. Wants labs rechecked    HPI Johnny Hall is a 19 y.o. male who presents to [facility] for a complete annual physical. He was seen here on February 22nd by Dr. Elder Cyphers, for vomiting diarrhea, and dehydration. He was treated with an IV and then transported to [facility] ER. Pt had and elevated white count of 16. Abdomen Pelvis CT scan was unremarkable. He was discharged home after improvement in ER with rehydration and diagnosis of gastroenteritis. He had a normal lipase, normal urine culture, normal blood culture, normal stool culture. Pt states he has been feeling a lot better since his hospitalization. He would like to get a blood workup done. He has a h/o Vitamin D deficiency so he would like to get that looked at.    Immunizations Immunization History  Administered Date(s) Administered  . Meningococcal Conjugate 11/20/2011  . Tdap 01/17/2006  Pt has not gotten a flu shot this year and would like to get one at this visit  STDs Pt last sexually activity was 2-3 months ago. He states that it was unprotected. He has no h/o sexually transmitted infections. He has had 3-4 lifetime sexual partners. His last STI check was in November 2015. He denies any penile discharge, penile rashes, or penile pain  Exercise Pt has not been exercising regularly. He states he would like to get back into exercising now that school is done and he hs been feeling better.  Dental He has not seen a dentist in the past 6 months  [place] He saw his opthalmologist one year ago and is due for an appt. Pt wears glasses.  Mental Health He has no h/o  depression or mental health issues. He denies any anhedonia, SI or hopelessness PHQ2 screening negative  Health Maintenance Pt would like to get the moles on his back looked at. He denies any family h/o skin cancer. He denies seeing a dermatologist to get the moles looked at. He states he used to go to [facility] several years ago. Pt also notes a mild small rash to his upper arms and right forearm. He states it does not itch and is not painful. He is unsure of the cause.  PCP: No PCP Per Patient   There are no active problems to display for this patient.  No past medical history on file. No past surgical history on file. No Known Allergies Prior to Admission medications   Not on File   History   Social History  . Marital Status: Single    Spouse Name: N/A  . Number of Children: N/A  . Years of Education: N/A   Occupational History  . Not on file.   Social History Main Topics  . Smoking status: Former Research scientist (life sciences)  . Smokeless tobacco: Not on file  . Alcohol Use: No  . Drug Use: No  . Sexual Activity: No   Other Topics Concern  . Not on file   Social History Narrative   Student in [place] - parents from [place]    Review of Systems  Genitourinary: Negative for discharge, genital sores  and penile pain.  Skin: Positive for rash.  Psychiatric/Behavioral: Negative for suicidal ideas and dysphoric mood.  13 point ROS per pt survey reviewed. Negative other than above     Objective:   Physical Exam  Constitutional: He is oriented to person, place, and time. He appears well-developed and well-nourished. No distress.  HENT:  Head: Normocephalic and atraumatic.  Eyes: Conjunctivae and EOM are normal.  Neck: Neck supple. No tracheal deviation present.  Cardiovascular: Normal rate.   Pulmonary/Chest: Effort normal. No respiratory distress.  Musculoskeletal: Normal range of motion.  Neurological: He is alert and oriented to person, place, and time.  Skin: Skin is  warm and dry.  Upper right shoulder small skin tag. Mid back elevated nevus  without surrounding inflmmation approximately 63mm medial to right shoulder blade. Flat light brown nevus on his left shoulder blade with one small dark area centrally approx 6 mm. Few other scattered small areas. Multiple erythematous comedones to upper back and few on the face Scattered slightly elevated small papular areas on upper, outer arms without erythema  Psychiatric: He has a normal mood and affect. His behavior is normal.  Nursing note and vitals reviewed.   Filed Vitals:   06/13/14 1035  BP: 100/78  Pulse: 76  Temp: 97.5 F (36.4 C)  TempSrc: Oral  Height: 5' 8.25" (1.734 m)  Weight: 144 lb 6 oz (65.488 kg)  SpO2: 100%      Assessment & Plan:  Johnny Hall is a 19 y.o. male Annual physical exam - Plan: RPR, HIV antibody, GC/Chlamydia Probe Amp  --anticipatory guidance as below in AVS, screening labs above. Health maintenance items as above in HPI discussed/recommended as applicable.   Elevated WBC count, Noninfectious gastroenteritis, unspecified  -resolved.  Will repeat CBC as prior elevated, but asymptomatic now  - Plan: CBC   Low serum vitamin D - Plan: Vitamin D, 25-hydroxy recheck as low in past.   Nevus - Plan: Ambulatory referral to Dermatology  - multiple nevi on back. Left sided with small darkened area centrally - refer to derm for eval.    Keratosis pilaris - Plan: Ambulatory referral to Dermatology  - discussed possible keratolytic creams, but minimal involvement currently, and can discuss with dermatology.   Acne vulgaris - Plan: Ambulatory referral to Dermatology  -otc treatment discussed initially and h/o provided, but with upper back involvement, systemic abx possible.     -Discuss with dermatology.   High risk sexual behavior - Plan: RPR, HIV antibody, GC/Chlamydia Probe Amp  -safer sex practices discussed.   No orders of the defined types were placed in this  encounter.   Patient Instructions  We will refer you to dermatologist, but you can try benzoyl peroxide for acne, Neutrogena or Cetaphil cleanser. If patch on arm not improved in next week - return for recheck.  Schedule your eye care provider and dentist visits. You should receive a call or letter about your lab results within the next week to 10 days.     Keeping you healthy  Get these tests  Blood pressure- Have your blood pressure checked once a year by your healthcare provider.  Normal blood pressure is 120/80.  Weight- Have your body mass index (BMI) calculated to screen for obesity.  BMI is a measure of body fat based on height and weight. You can also calculate your own BMI at GravelBags.it.  Cholesterol- Have your cholesterol checked regularly starting at age 14, sooner may be necessary if you have diabetes, high blood  pressure, if a family member developed heart diseases at an early age or if you smoke.   Chlamydia, HIV, and other sexual transmitted disease- Get screened each year until the age of 33 then within three months of each new sexual partner.  Diabetes- Have your blood sugar checked regularly if you have high blood pressure, high cholesterol, a family history of diabetes or if you are overweight.  Get these vaccines  Flu shot- Every fall.  Tetanus shot- Every 10 years.  Menactra- Single dose; prevents meningitis.  Take these steps  Don't smoke- If you do smoke, ask your healthcare provider about quitting. For tips on how to quit, go to www.smokefree.gov or call 1-800-QUIT-NOW.  Be physically active- Exercise 5 days a week for at least 30 minutes.  If you are not already physically active start slow and gradually work up to 30 minutes of moderate physical activity.  Examples of moderate activity include walking briskly, mowing the yard, dancing, swimming bicycling, etc.  Eat a healthy diet- Eat a variety of healthy foods such as fruits, vegetables,  low fat milk, low fat cheese, yogurt, lean meats, poultry, fish, beans, tofu, etc.  For more information on healthy eating, go to www.thenutritionsource.org  Drink alcohol in moderation- Limit alcohol intake two drinks or less a day.  Never drink and drive.  Dentist- Brush and floss teeth twice daily; visit your dentis twice a year.  Depression-Your emotional health is as important as your physical health.  If you're feeling down, losing interest in things you normally enjoy please talk with your healthcare provider.  Gun Safety- If you keep a gun in your home, keep it unloaded and with the safety lock on.  Bullets should be stored separately.  Helmet use- Always wear a helmet when riding a motorcycle, bicycle, rollerblading or skateboarding.  Safe sex- If you may be exposed to a sexually transmitted infection, use a condom  Seat belts- Seat bels can save your life; always wear one.  Smoke/Carbon Monoxide detectors- These detectors need to be installed on the appropriate level of your home.  Replace batteries at least once a year.  Skin Cancer- When out in the sun, cover up and use sunscreen SPF 15 or higher.  Violence- If anyone is threatening or hurting you, please tell your healthcare provider.  Acne Acne is a skin problem that causes pimples. Acne occurs when the pores in your skin get blocked. Your pores may become red, sore, and swollen (inflamed), or infected with a common skin bacterium (Propionibacterium acnes). Acne is a common skin problem. Up to 80% of people get acne at some time. Acne is especially common from the ages of 64 to 62. Acne usually goes away over time with proper treatment. CAUSES  Your pores each contain an oil gland. The oil glands make an oily substance called sebum. Acne happens when these glands get plugged with sebum, dead skin cells, and dirt. The P. acnes bacteria that are normally found in the oil glands then multiply, causing inflammation. Acne is  commonly triggered by changes in your hormones. These hormonal changes can cause the oil glands to get bigger and to make more sebum. Factors that can make acne worse include:  Hormone changes during adolescence.  Hormone changes during women's menstrual cycles.  Hormone changes during pregnancy.  Oil-based cosmetics and hair products.  Harshly scrubbing the skin.  Strong soaps.  Stress.  Hormone problems due to certain diseases.  Long or oily hair rubbing against the skin.  Certain medicines.  Pressure from headbands, backpacks, or shoulder pads.  Exposure to certain oils and chemicals. SYMPTOMS  Acne often occurs on the face, neck, chest, and upper back. Symptoms include:  Small, red bumps (pimples or papules).  Whiteheads (closed comedones).  Blackheads (open comedones).  Small, pus-filled pimples (pustules).  Big, red pimples or pustules that feel tender. More severe acne can cause:  An infected area that contains a collection of pus (abscess).  Hard, painful, fluid-filled sacs (cysts).  Scars. DIAGNOSIS  Your caregiver can usually tell what the problem is by doing a physical exam. TREATMENT  There are many good treatments for acne. Some are available over the counter and some are available with a prescription. The treatment that is best for you depends on the type of acne you have and how severe it is. It may take 2 months of treatment before your acne gets better. Common treatments include:  Creams and lotions that prevent oil glands from clogging.  Creams and lotions that treat or prevent infections and inflammation.  Antibiotics applied to the skin or taken as a pill.  Pills that decrease sebum production.  Birth control pills.  Light or laser treatments.  Minor surgery.  Injections of medicine into the affected areas.  Chemicals that cause peeling of the skin. HOME CARE INSTRUCTIONS  Good skin care is the most important part of  treatment.  Wash your skin gently at least twice a day and after exercise. Always wash your skin before bed.  Use mild soap.  After each wash, apply a water-based skin moisturizer.  Keep your hair clean and off of your face. Shampoo your hair daily.  Only take medicines as directed by your caregiver.  Use a sunscreen or sunblock with SPF 30 or greater. This is especially important when you are using acne medicines.  Choose cosmetics that are noncomedogenic. This means they do not plug the oil glands.  Avoid leaning your chin or forehead on your hands.  Avoid wearing tight headbands or hats.  Avoid picking or squeezing your pimples. This can make your acne worse and cause scarring. SEEK MEDICAL CARE IF:   Your acne is not better after 8 weeks.  Your acne gets worse.  You have a large area of skin that is red or tender. Document Released: 03/24/2000 Document Revised: 08/11/2013 Document Reviewed: 01/13/2011 [facility] Patient Information 2015 [place], [place]. This information is not intended to replace advice given to you by your health care provider. Make sure you discuss any questions you have with your health care provider.     I personally performed the services described in this documentation, which was scribed in my presence. The recorded information has been reviewed and considered, and addended by me as needed.

## 2014-06-15 LAB — VITAMIN D 25 HYDROXY (VIT D DEFICIENCY, FRACTURES): VIT D 25 HYDROXY: 14 ng/mL — AB (ref 30–100)

## 2014-06-16 LAB — GC/CHLAMYDIA PROBE AMP
CT Probe RNA: NEGATIVE
GC PROBE AMP APTIMA: NEGATIVE

## 2014-06-23 MED ORDER — VITAMIN D (ERGOCALCIFEROL) 1.25 MG (50000 UNIT) PO CAPS: 50000.0000 [IU] | ORAL_CAPSULE | ORAL | Status: DC

## 2014-06-23 NOTE — Addendum Note (Signed)
Addended by: Merri Ray R on: 06/23/2014 03:08 PM   Modules accepted: Orders

## 2014-09-26 ENCOUNTER — Ambulatory Visit (INDEPENDENT_AMBULATORY_CARE_PROVIDER_SITE_OTHER): Payer: BLUE CROSS/BLUE SHIELD | Admitting: Physician Assistant

## 2014-09-26 VITALS — BP 102/88 | HR 80 | Temp 98.1°F | Resp 16 | Ht 68.25 in | Wt 144.0 lb

## 2014-09-26 DIAGNOSIS — B9789 Other viral agents as the cause of diseases classified elsewhere: Principal | ICD-10-CM

## 2014-09-26 DIAGNOSIS — J069 Acute upper respiratory infection, unspecified: Secondary | ICD-10-CM | POA: Diagnosis not present

## 2014-09-26 MED ORDER — GUAIFENESIN ER 1200 MG PO TB12: 1.0000 | ORAL_TABLET | Freq: Two times a day (BID) | ORAL | Status: DC | PRN

## 2014-09-26 MED ORDER — IPRATROPIUM BROMIDE 0.03 % NA SOLN: 2.0000 | Freq: Two times a day (BID) | NASAL | Status: DC

## 2014-09-26 NOTE — Progress Notes (Signed)
[facility] [address] [place] [address] [phone]  Date:  09/26/2014   Name:  Johnny Hall   DOB:  1995/09/14   MRN:  [id number]  PCP:  No PCP Per Patient    Chief Complaint: Sore Throat; Nasal Congestion; Cough; and Ear Fullness   History of Present Illness:  This is a 19 y.o. male who is presenting with sore throat, nasal congestion and ear fullness x 4 days. He has a cough that started today. Cough is dry. Sore throat is resolving. Denies fever, chills, SOB, wheezing, otalgia. Thought it was allergies so took claritin-D once and no help. Also tried nasal spray and helped some but symptoms came back. Has env allergies but never had this bad. No history of asthma. Pt practices ramadan and currently is fasting during the days.  Review of Systems:  Review of Systems See HPI.  There are no active problems to display for this patient.   Prior to Admission medications   Medication Sig Start Date End Date Taking? Authorizing Provider  Vitamin D, Ergocalciferol, (DRISDOL) 50000 UNITS CAPS capsule Take 1 capsule (50,000 Units total) by mouth every 7 (seven) days. Patient not taking: Reported on 09/26/2014 06/23/14   Wendie Agreste, MD    No Known Allergies  History reviewed. No pertinent past surgical history.  History  Substance Use Topics  . Smoking status: Former Research scientist (life sciences)  . Smokeless tobacco: Not on file  . Alcohol Use: No    Family History  Problem Relation Age of Onset  . Hyperlipidemia Mother   . Hypertension Mother     Medication list has been reviewed and updated.  Physical Examination:  Physical Exam  Constitutional: He is oriented to person, place, and time. He appears well-developed and well-nourished. No distress.  HENT:  Head: Normocephalic and atraumatic.  Right Ear: Hearing, external ear and ear canal normal.  Left Ear: Hearing, external ear and ear canal normal. Tympanic membrane is retracted.  Nose: Mucosal edema  present. Right sinus exhibits no maxillary sinus tenderness and no frontal sinus tenderness. Left sinus exhibits no maxillary sinus tenderness and no frontal sinus tenderness.  Mouth/Throat: Uvula is midline and mucous membranes are normal. Posterior oropharyngeal erythema present. No oropharyngeal exudate or posterior oropharyngeal edema.  Right TM retracted and erythematous, no purulence.  Eyes: Conjunctivae and lids are normal. Right eye exhibits no discharge. Left eye exhibits no discharge. No scleral icterus.  Cardiovascular: Normal rate, regular rhythm, normal heart sounds and normal pulses.   No murmur heard. Pulmonary/Chest: Effort normal and breath sounds normal. No respiratory distress. He has no wheezes. He has no rhonchi. He has no rales.  Musculoskeletal: Normal range of motion.  Lymphadenopathy:       Head (right side): No submental, no submandibular and no tonsillar adenopathy present.       Head (left side): No submental, no submandibular and no tonsillar adenopathy present.    He has cervical adenopathy (bilateral anterior).  Neurological: He is alert and oriented to person, place, and time.  Skin: Skin is warm, dry and intact. No lesion and no rash noted.  Psychiatric: He has a normal mood and affect. His speech is normal and behavior is normal. Thought content normal.   BP 102/88 mmHg  Pulse 80  Temp(Src) 98.1 F (36.7 C) (Oral)  Resp 16  Ht 5' 8.25" (1.734 m)  Wt 144 lb (65.318 kg)  BMI 21.72 kg/m2  SpO2 98%  Assessment and Plan:  1.  Viral URI with cough Etiology likely viral at this point. Focus on prevention of sinus infection. atrovent BID, mucinex BID, plenty of water. Discussed home remedies to help sinuses drain. Right TM erythematous but I do not believe infected at this point. If symptoms are not improving in 5-6 days, he will call and I will be happy to prescribe abx. - ipratropium (ATROVENT) 0.03 % nasal spray; Place 2 sprays into both nostrils 2 (two)  times daily.  Dispense: 30 mL; Refill: 0 - Guaifenesin (MUCINEX MAXIMUM STRENGTH) 1200 MG TB12; Take 1 tablet (1,200 mg total) by mouth every 12 (twelve) hours as needed.  Dispense: 14 tablet; Refill: 1   Nicole V. Drenda Freeze, MHS [facility]  09/26/2014

## 2014-09-26 NOTE — Patient Instructions (Signed)
If prescribed mucinex, take twice a day with plenty of water (64 oz per day). If prescribed nasal spray, use twice a day. Hot showers, breathing in steam from shower or pot of boiling onions, eating spicy food and neti pot with sterile water can all help your sinuses drain. If you are not getting better in 5-6 days, call and I will be happy to call in antibiotic.

## 2014-10-27 ENCOUNTER — Ambulatory Visit (INDEPENDENT_AMBULATORY_CARE_PROVIDER_SITE_OTHER): Payer: BC Managed Care – PPO | Admitting: Family Medicine

## 2014-10-27 VITALS — BP 118/70 | HR 65 | Temp 97.7°F | Resp 14 | Ht 68.0 in | Wt 146.0 lb

## 2014-10-27 DIAGNOSIS — R1084 Generalized abdominal pain: Secondary | ICD-10-CM | POA: Diagnosis not present

## 2014-10-27 DIAGNOSIS — R110 Nausea: Secondary | ICD-10-CM

## 2014-10-27 DIAGNOSIS — R197 Diarrhea, unspecified: Secondary | ICD-10-CM | POA: Diagnosis not present

## 2014-10-27 MED ORDER — ONDANSETRON 4 MG PO TBDP: ORAL_TABLET | ORAL | Status: DC

## 2014-10-27 NOTE — Progress Notes (Signed)
  Subjective:  Patient ID: Johnny Hall, male    DOB: 1995-07-03  Age: 19 y.o. MRN: [id number]  Patient is been having diarrhea all morning. He went to a movie last night and ate a lot of possible. This morning he was awakened and had diarrhea. He has had about 9 times, including 2 times the office. He has crampy abdominal pain. Nausea but no vomiting. Has not passed any blood. He had an episode back in February with an intestinal virus. No one else that he has been around has had the intestinal virus this time.   Objective:   No fever. Alert and oriented. Chest clear. Heart regular without murmurs. Abdomen has very active bowel sounds. Soft without masses. Mild generalized tenderness. Skin turgor good. He says he has urinated this morning.  Assessment & Plan:   Assessment: Gastroenteritis with diarrhea nausea and abdominal pain   Plan: Patient Instructions  Drink plenty of fluids, especially clear liquids. It is important to be taking in enough liquids see your still urinating, that can be a change of weather you are hydrating himself well enough.  Take Pepto-Bismol per directions on the container  Take ondansetron if needed for nausea. If you're still continuing to have loose bowels by late evening you can go ahead and begin on some Imodium to try and slow diarrhea down  In the event of high fever or more acute abdominal pain or passing blood you should get checked right away  If you're feeling like you're not doing any better by late afternoon come back and we will give you some IV fluids  Tylenol 2 tablets every 6 or 8 hours if needed for pain. Advise avoiding Advil and Aleve(ibuprofen and naproxen) because they may upset the stomach more at this point.    Deandre Brannan, MD 10/27/2014

## 2014-10-27 NOTE — Patient Instructions (Signed)
Drink plenty of fluids, especially clear liquids. It is important to be taking in enough liquids see your still urinating, that can be a change of weather you are hydrating himself well enough.  Take Pepto-Bismol per directions on the container  Take ondansetron if needed for nausea. If you're still continuing to have loose bowels by late evening you can go ahead and begin on some Imodium to try and slow diarrhea down  In the event of high fever or more acute abdominal pain or passing blood you should get checked right away  If you're feeling like you're not doing any better by late afternoon come back and we will give you some IV fluids  Tylenol 2 tablets every 6 or 8 hours if needed for pain. Advise avoiding Advil and Aleve(ibuprofen and naproxen) because they may upset the stomach more at this point.

## 2015-08-06 ENCOUNTER — Ambulatory Visit (INDEPENDENT_AMBULATORY_CARE_PROVIDER_SITE_OTHER): Payer: BC Managed Care – PPO | Admitting: Family Medicine

## 2015-08-06 VITALS — BP 110/68 | HR 82 | Temp 98.3°F | Resp 16 | Ht 68.0 in | Wt 148.0 lb

## 2015-08-06 DIAGNOSIS — R42 Dizziness and giddiness: Secondary | ICD-10-CM | POA: Diagnosis not present

## 2015-08-06 DIAGNOSIS — R197 Diarrhea, unspecified: Secondary | ICD-10-CM | POA: Diagnosis not present

## 2015-08-06 LAB — COMPLETE METABOLIC PANEL WITH GFR
ALT: 15 U/L (ref 9–46)
AST: 14 U/L (ref 10–40)
Albumin: 4.9 g/dL (ref 3.6–5.1)
Alkaline Phosphatase: 59 U/L (ref 40–115)
BUN: 12 mg/dL (ref 7–25)
CO2: 29 mmol/L (ref 20–31)
Calcium: 9.9 mg/dL (ref 8.6–10.3)
Chloride: 101 mmol/L (ref 98–110)
Creat: 1 mg/dL (ref 0.60–1.35)
GFR, Est African American: >89 mL/min (ref >=60)
GFR, Est Non African American: >89 mL/min (ref >=60)
Glucose, Bld: 86 mg/dL (ref 65–99)
Potassium: 4.7 mmol/L (ref 3.5–5.3)
Sodium: 139 mmol/L (ref 135–146)
Total Bilirubin: 0.9 mg/dL (ref 0.2–1.2)
Total Protein: 7.6 g/dL (ref 6.1–8.1)

## 2015-08-06 LAB — THYROID PANEL WITH TSH
Free Thyroxine Index: 2.6 (ref 1.4–3.8)
T3 Uptake: 31 % (ref 22–35)
T4, Total: 8.4 ug/dL (ref 4.5–12.0)
TSH: 1.15 mIU/L (ref 0.40–4.50)

## 2015-08-06 LAB — POCT URINALYSIS DIP (MANUAL ENTRY)
Bilirubin, UA: NEGATIVE
Blood, UA: NEGATIVE
Glucose, UA: NEGATIVE
Ketones, POC UA: NEGATIVE
Leukocytes, UA: NEGATIVE
Nitrite, UA: NEGATIVE
Protein Ur, POC: NEGATIVE
Spec Grav, UA: 1.02
Urobilinogen, UA: 1
pH, UA: 8.5

## 2015-08-06 LAB — POCT CBC
Granulocyte percent: 52.5 %G (ref 37–80)
HCT, POC: 43.7 % (ref 43.5–53.7)
Hemoglobin: 15.9 g/dL (ref 14.1–18.1)
Lymph, poc: 1.6 (ref 0.6–3.4)
MCH, POC: 31.3 pg — AB (ref 27–31.2)
MCHC: 36.4 g/dL — AB (ref 31.8–35.4)
MCV: 86 fL (ref 80–97)
MID (cbc): 0.2 (ref 0–0.9)
MPV: 9 fL (ref 0–99.8)
POC Granulocyte: 2 (ref 2–6.9)
POC LYMPH PERCENT: 42.7 %L (ref 10–50)
POC MID %: 4.8 %M (ref 0–12)
Platelet Count, POC: 184 10*3/uL (ref 142–424)
RBC: 5.09 M/uL (ref 4.69–6.13)
RDW, POC: 12.3 %
WBC: 3.8 10*3/uL — AB (ref 4.6–10.2)

## 2015-08-06 LAB — LIPASE: Lipase: 26 U/L (ref 7–60)

## 2015-08-06 LAB — POCT SEDIMENTATION RATE

## 2015-08-06 NOTE — Patient Instructions (Addendum)
May 8:  8-2 May 9: 8-2 May 10: 1-6

## 2015-08-06 NOTE — Progress Notes (Signed)
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 He comes in with a two-year history of intermittent watery yellow diarrhea, nausea. He does not have any blood in his stool but he does have some cramping. His mother notices that is quite pale in the morning.  Patient does not notice any association of his diarrhea with particular foods.  Patient also has a history of disequilibrium where his surroundings appear warped or out of place to 3 times a week, lasting a few minutes, and not associated with headache or fever. These episodes of disequilibrium are not associated with the diarrhea mentioned above.  Family history is positive for gallbladder disease.  Objective:BP 110/68 mmHg  Pulse 82  Temp(Src) 98.3 F (36.8 C) (Oral)  Resp 16  Ht 5\' 8"  (1.727 m)  Wt 148 lb (67.132 kg)  BMI 22.51 kg/m2  SpO2 99% Patient appears pale but otherwise healthy. He is thin and has normal body habitus. HEENT: Normal fundi with sharp disc margins, normal TMs, normal oropharynx with no ulcerations Neck: Supple no adenopathy or thyromegaly Chest: Clear to auscultation Heart: Regular, no murmur or gallop Abdomen: Soft nontender with hyperactive bowel sounds, no HSM or guarding or rebound Extremities: No edema Skin: No rashes  Results for orders placed or performed in visit on 08/06/15  POCT CBC  Result Value Ref Range   WBC 3.8 (A) 4.6 - 10.2 K/uL   Lymph, poc 1.6 0.6 - 3.4   POC LYMPH PERCENT 42.7 10 - 50 %L   MID (cbc) 0.2 0 - 0.9   POC MID % 4.8 0 - 12 %M   POC Granulocyte 2.0 2 - 6.9   Granulocyte percent 52.5 37 - 80 %G   RBC 5.09 4.69 - 6.13 M/uL   Hemoglobin 15.9 14.1 - 18.1 g/dL   HCT, POC 43.7 43.5 - 53.7 %   MCV 86.0 80 - 97 fL   MCH, POC 31.3 (A) 27 - 31.2 pg   MCHC 36.4 (A) 31.8 - 35.4 g/dL   RDW, POC 12.3 %   Platelet Count, POC 184 142 - 424 K/uL   MPV 9.0 0 - 99.8 fL  POCT SEDIMENTATION RATE  Result Value Ref Range   POCT SED RATE  0 - 22 mm/hr  POCT urinalysis  dipstick  Result Value Ref Range   Color, UA yellow yellow   Clarity, UA clear clear   Glucose, UA negative negative   Bilirubin, UA negative negative   Ketones, POC UA negative negative   Spec Grav, UA 1.020    Blood, UA negative negative   pH, UA 8.5    Protein Ur, POC negative negative   Urobilinogen, UA 1.0    Nitrite, UA Negative Negative   Leukocytes, UA Negative Negative   Results for orders placed or performed in visit on 08/06/15  POCT CBC  Result Value Ref Range   WBC 3.8 (A) 4.6 - 10.2 K/uL   Lymph, poc 1.6 0.6 - 3.4   POC LYMPH PERCENT 42.7 10 - 50 %L   MID (cbc) 0.2 0 - 0.9   POC MID % 4.8 0 - 12 %M   POC Granulocyte 2.0 2 - 6.9   Granulocyte percent 52.5 37 - 80 %G   RBC 5.09 4.69 - 6.13 M/uL   Hemoglobin 15.9 14.1 - 18.1 g/dL   HCT, POC 43.7 43.5 - 53.7 %   MCV 86.0 80 - 97 fL   MCH, POC 31.3 (A) 27 - 31.2 pg  MCHC 36.4 (A) 31.8 - 35.4 g/dL   RDW, POC 12.3 %   Platelet Count, POC 184 142 - 424 K/uL   MPV 9.0 0 - 99.8 fL  POCT SEDIMENTATION RATE  Result Value Ref Range   POCT SED RATE  0 - 22 mm/hr  POCT urinalysis dipstick  Result Value Ref Range   Color, UA yellow yellow   Clarity, UA clear clear   Glucose, UA negative negative   Bilirubin, UA negative negative   Ketones, POC UA negative negative   Spec Grav, UA 1.020    Blood, UA negative negative   pH, UA 8.5    Protein Ur, POC negative negative   Urobilinogen, UA 1.0    Nitrite, UA Negative Negative   Leukocytes, UA Negative Negative      Diarrhea, unspecified type - Plan: POCT CBC, POCT SEDIMENTATION RATE, POCT urinalysis dipstick, COMPLETE METABOLIC PANEL WITH GFR, Thyroid Panel With TSH, Lipase, Ambulatory referral to Gastroenterology, US Abdomen Complete, ANA, CANCELED: ANA, IFA Comprehensive Panel  Disequilibrium - Plan: POCT CBC, POCT SEDIMENTATION RATE, POCT urinalysis dipstick, COMPLETE METABOLIC PANEL WITH GFR, Thyroid Panel With TSH, Lipase, Ambulatory referral to Neurology, ANA,  CANCELED: ANA, IFA Comprehensive Panel  Follow up week of May 8 Robyn Haber, MD

## 2015-08-09 LAB — ANA: Anti Nuclear Antibody(ANA): NEGATIVE

## 2015-08-12 ENCOUNTER — Ambulatory Visit
Admission: RE | Admit: 2015-08-12 | Discharge: 2015-08-12 | Disposition: A | Discharge status: Home / Self Care | Payer: BLUE CROSS/BLUE SHIELD | Source: Ambulatory Visit | Attending: Family Medicine | Admitting: Family Medicine

## 2015-08-12 DIAGNOSIS — R197 Diarrhea, unspecified: Secondary | ICD-10-CM

## 2015-08-13 ENCOUNTER — Telehealth: Payer: Self-pay | Admitting: Emergency Medicine

## 2015-08-13 NOTE — Telephone Encounter (Signed)
Pt given [place] results. Already received GI appt.

## 2015-08-16 ENCOUNTER — Encounter: Payer: Self-pay | Admitting: Neurology

## 2015-08-16 ENCOUNTER — Ambulatory Visit (INDEPENDENT_AMBULATORY_CARE_PROVIDER_SITE_OTHER): Payer: BC Managed Care – PPO | Admitting: Neurology

## 2015-08-16 VITALS — BP 123/81 | HR 72 | Ht 68.0 in | Wt 150.0 lb

## 2015-08-16 DIAGNOSIS — R51 Headache: Secondary | ICD-10-CM

## 2015-08-16 DIAGNOSIS — R42 Dizziness and giddiness: Secondary | ICD-10-CM | POA: Insufficient documentation

## 2015-08-16 DIAGNOSIS — R519 Headache, unspecified: Secondary | ICD-10-CM

## 2015-08-16 NOTE — Patient Instructions (Signed)
Magnesium oxide 400 mg twice a day Riboflavin  100 mg twice a day 

## 2015-08-16 NOTE — Progress Notes (Signed)
PATIENT: Johnny Hall DOB: [birth year]  Chief Complaint  Patient presents with  . Disequilibrium    He is here with his mother, Lattie Corns. He has been having intermittent episodes of feeling his "brain is being squeezed" then it is followed by dizziness.  Episodes last a few seconds to several minutes and have occurred approximately three times in the last month.  He denies worsening with positional changes.       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  Since 2016, he began to have different episode, often preceded by dizziness, visual distortion, lasting few seconds to a few minutes, followed by generalized low-grade pressure headaches, lasting 1-2 hours, mild nausea light sensitivity sometimes, it getting worse since April 2017, it happened once or twice each week, he was not able to identify any triggers. He also complains of frequent tinnitus.    In between episodes, he denies visual loss, no lateralized motor or sensory deficit.  I reviewed laboratory in 2017, mild decreased WBC 3.8, normal CMP, TSH, previously negative RPR, HIV, low vitamin D 14 in 2016   REVIEW OF SYSTEMS: Full 14 system review of systems performed and notable only for Ringing ears, fatigue, diarrhea, headache, dizziness, sleepiness, decreased energy  ALLERGIES: No Known Allergies  HOME MEDICATIONS: No current outpatient prescriptions on file.   No current facility-administered medications for this visit.    PAST  MEDICAL HISTORY: Past Medical History  Diagnosis Date  . Disequilibrium     PAST SURGICAL HISTORY: Past Surgical History  Procedure Laterality Date  . No past surgeries      FAMILY HISTORY: Family History  Problem Relation Age of Onset  . Hyperlipidemia Mother   . Healthy Father     SOCIAL HISTORY:  Social History   Social History  . Marital Status: Single    Spouse Name: N/A  . Number of Children: 0  . Years of Education: College   Occupational History  . Property Manager    Social History Main Topics  . Smoking status: Former Research scientist (life sciences)  . Smokeless tobacco: Not on file  . Alcohol Use: No  . Drug Use: No  . Sexual Activity: No   Other Topics Concern  . Not on file   Social History Narrative   Student in [place] - parents from [place].   Lives at home with parents.   Right-handed.   No caffeine use.     PHYSICAL EXAM   Filed Vitals:   08/16/15 0808  BP: 123/81  Pulse: 72  Height: 5\' 8"  (1.727 m)  Weight: 150 lb (68.04 kg)    Not recorded      Body mass index is 22.81 kg/(m^2).  PHYSICAL EXAMNIATION:  Gen: NAD, conversant, well nourised, obese, well groomed                     Cardiovascular: Regular rate rhythm, no peripheral edema, warm, nontender. Eyes: Conjunctivae clear without exudates or hemorrhage Neck: Supple, no carotid bruise. Pulmonary: Clear  to auscultation bilaterally   NEUROLOGICAL EXAM:  MENTAL STATUS: Speech:    Speech is normal; fluent and spontaneous with normal comprehension.  Cognition:     Orientation to time, place and person     Normal recent and remote memory     Normal Attention span and concentration     Normal Language, naming, repeating,spontaneous speech     Fund of knowledge   CRANIAL NERVES: CN II: Visual fields are full to confrontation. Fundoscopic exam is normal with sharp discs and no vascular changes. Pupils are round equal and briskly reactive to light. CN III, IV, VI: extraocular movement are  normal. No ptosis. CN V: Facial sensation is intact to pinprick in all 3 divisions bilaterally. Corneal responses are intact.  CN VII: Face is symmetric with normal eye closure and smile. CN VIII: Hearing is normal to rubbing fingers CN IX, X: Palate elevates symmetrically. Phonation is normal. CN XI: Head turning and shoulder shrug are intact CN XII: Tongue is midline with normal movements and no atrophy.  MOTOR: There is no pronator drift of out-stretched arms. Muscle bulk and tone are normal. Muscle strength is normal.  REFLEXES: Reflexes are 2+ and symmetric at the biceps, triceps, knees, and ankles. Plantar responses are flexor.  SENSORY: Intact to light touch, pinprick, positional sensation and vibratory sensation are intact in fingers and toes.  COORDINATION: Rapid alternating movements and fine finger movements are intact. There is no dysmetria on finger-to-nose and heel-knee-shin.    GAIT/STANCE: Posture is normal. Gait is steady with normal steps, base, arm swing, and turning. Heel and toe walking are normal. Tandem gait is normal.  Romberg is absent.   DIAGNOSTIC DATA (LABS, IMAGING, TESTING) - I reviewed patient records, labs, notes, testing and imaging myself where available.   ASSESSMENT AND PLAN  Johnny Hall is a 20 y.o. male   Frequent headaches, preceded by dizziness, visual distortion  After discuss with patient and his family, we decided to proceed MRI of the brain to rule out structural lesion  Differentiation diagnosis also includes migraine variants  I have suggested magnesium oxide, riboflavin as preventive medications   History of vitamin D deficiency   I have suggested him continue vitamin D3 supplement  Marcial Pacas, M.D. Ph.D.  [facility] [address] [place], [place] [address] Ph: [phone] Fax: [phone]  CC: Robyn Haber, MD

## 2015-08-26 ENCOUNTER — Telehealth: Payer: Self-pay | Admitting: *Deleted

## 2015-08-26 NOTE — Telephone Encounter (Signed)
------------------------------------------------------------   Johnny Hall            CID [id number]  Patient SAME                 Pt's Dr PT UNSURE    Area Code 336 Phone# [phone] * DOB [birth year]     RE PT MISSED CALL FROM OFFICE 5/18-PT THINKS IT      MIGHT BE ABOUT AN MRI                                Disp:Y/N N If Y = C/B If No Response In 22minutes ============================================================

## 2015-08-27 NOTE — Telephone Encounter (Signed)
Returned patients call. He does not want to schedule MRI.

## 2015-09-27 ENCOUNTER — Ambulatory Visit: Payer: BC Managed Care – PPO | Admitting: Neurology

## 2016-07-08 ENCOUNTER — Ambulatory Visit (INDEPENDENT_AMBULATORY_CARE_PROVIDER_SITE_OTHER): Payer: No Typology Code available for payment source | Admitting: Physician Assistant

## 2016-07-08 ENCOUNTER — Ambulatory Visit (INDEPENDENT_AMBULATORY_CARE_PROVIDER_SITE_OTHER): Payer: No Typology Code available for payment source

## 2016-07-08 VITALS — BP 123/75 | HR 68 | Resp 18 | Wt 150.0 lb

## 2016-07-08 DIAGNOSIS — S9031XA Contusion of right foot, initial encounter: Secondary | ICD-10-CM | POA: Diagnosis not present

## 2016-07-08 DIAGNOSIS — S86011A Strain of right Achilles tendon, initial encounter: Secondary | ICD-10-CM

## 2016-07-08 MED ORDER — IBUPROFEN 800 MG PO TABS: 400.0000 mg | ORAL_TABLET | Freq: Three times a day (TID) | ORAL | 0 refills | Status: AC

## 2016-07-08 NOTE — Progress Notes (Signed)
07/08/2016 12:51 PM   DOB: [birth year] / MRN: [id number]  SUBJECTIVE:  Johnny Hall is a 21 y.o. male presenting for weakness and ankle pain.  Says he was going into a running stride yesterday and felt and heard a loud pop.  Has had difficulty with plantar flexion since as well as walking.  He has been using crutches. No abx in the last six months.   He has No Known Allergies.   He  has a past medical history of Disequilibrium.    He  reports that he has quit smoking. He does not have any smokeless tobacco history on file. He reports that he does not drink alcohol or use drugs. He  reports that he does not engage in sexual activity. The patient  has a past surgical history that includes No past surgeries.  His family history includes Healthy in his father; Hyperlipidemia in his mother.  Review of Systems  Constitutional: Negative for fever.  Respiratory: Negative for cough.   Musculoskeletal: Positive for joint pain and myalgias. Negative for falls.  Neurological: Negative for dizziness.    The problem list and medications were reviewed and updated by myself where necessary and exist elsewhere in the encounter.   OBJECTIVE:  BP 123/75 (Cuff Size: Normal)   Pulse 68   Resp 18   Wt 150 lb (68 kg)   SpO2 98%   BMI 22.81 kg/m   Physical Exam  Constitutional: He is oriented to person, place, and time.  Musculoskeletal: Normal range of motion. He exhibits tenderness. He exhibits no edema or deformity.       Left ankle: No tenderness. No lateral malleolus, no medial malleolus and no head of 5th metatarsal tenderness found.       Feet:  Neurological: He is alert and oriented to person, place, and time. He displays normal reflexes. No cranial nerve deficit. He exhibits normal muscle tone. Coordination normal.    No results found for this or any previous visit (from the past 72 hour(s)).  Dg Ankle Complete Right  Result Date: 07/08/2016 CLINICAL DATA:  Pain and bruising both  sides of ankle. Running,achilles tendon vs fx shielded EXAM: RIGHT ANKLE - COMPLETE 3+ VIEW COMPARISON:  None. FINDINGS: There is mild irregularity along the inferior margin of the medial malleolus, which could reflect an avulsion fracture or secondary ossification center. No other evidence of a fracture. The ankle mortise is normally spaced and aligned. Mild soft tissue swelling. The Achilles tendon shadow is intact. There is some edema in the pre Achilles fat pad. IMPRESSION: 1. Possible nondisplaced avulsion fracture from the medial malleolus versus a secondary ossification center, the latter favored. 2. Nonspecific mild soft tissue edema and pre Achilles edema. No evidence of an Achilles tendon rupture. Electronically Signed   By: Lajean Manes M.D.   On: 07/08/2016 12:34    ASSESSMENT AND PLAN:  Johnny Hall was seen today for leg injury.  Diagnoses and all orders for this visit:  Rupture of right Achilles tendon, initial encounter: Rads calling avulsion however he has a positive Thompson test and no bony tenderness.  Will proceed with plantar flexion splint and have him see ortho in the event that surgery is necessary.  -     ibuprofen (ADVIL,MOTRIN) 800 MG tablet; Take 0.5-1 tablets (400-800 mg total) by mouth 3 (three) times daily. Take with food. Do not take other NSAID pain reliever with this medication.  Traumatic ecchymosis of right foot, initial encounter -     DG Ankle  Complete Right; Future -     AMB referral to orthopedics    The patient is advised to call or return to clinic if he does not see an improvement in symptoms, or to seek the care of the closest emergency department if he worsens with the above plan.   Philis Fendt, MHS, PA-C [facility] 07/08/2016 12:51 PM

## 2016-07-08 NOTE — Patient Instructions (Signed)
     IF you received an x-ray today, you will receive an invoice from [facility]. Please contact [facility] at [phone] with questions or concerns regarding your invoice.   IF you received labwork today, you will receive an invoice from [facility]. Please contact [facility] at [phone] with questions or concerns regarding your invoice.   Our billing staff will not be able to assist you with questions regarding bills from these companies.  You will be contacted with the lab results as soon as they are available. The fastest way to get your results is to activate your My Chart account. Instructions are located on the last page of this paperwork. If you have not heard from us regarding the results in 2 weeks, please contact this office.     

## 2017-11-28 ENCOUNTER — Encounter (HOSPITAL_COMMUNITY): Payer: Self-pay | Admitting: Emergency Medicine

## 2017-11-28 ENCOUNTER — Other Ambulatory Visit: Payer: Self-pay

## 2017-11-28 DIAGNOSIS — Z79899 Other long term (current) drug therapy: Secondary | ICD-10-CM | POA: Diagnosis not present

## 2017-11-28 DIAGNOSIS — Z87891 Personal history of nicotine dependence: Secondary | ICD-10-CM | POA: Diagnosis not present

## 2017-11-28 DIAGNOSIS — R1031 Right lower quadrant pain: Secondary | ICD-10-CM | POA: Diagnosis present

## 2017-11-28 LAB — CBC
HCT: 46.1 % (ref 39.0–52.0)
HEMOGLOBIN: 15.9 g/dL (ref 13.0–17.0)
MCH: 30.6 pg (ref 26.0–34.0)
MCHC: 34.5 g/dL (ref 30.0–36.0)
MCV: 88.8 fL (ref 78.0–100.0)
Platelets: 220 10*3/uL (ref 150–400)
RBC: 5.19 MIL/uL (ref 4.22–5.81)
RDW: 11.9 % (ref 11.5–15.5)
WBC: 5.8 10*3/uL (ref 4.0–10.5)

## 2017-11-28 LAB — LIPASE, BLOOD: LIPASE: 42 U/L (ref 11–51)

## 2017-11-28 LAB — COMPREHENSIVE METABOLIC PANEL
ALBUMIN: 4.7 g/dL (ref 3.5–5.0)
ALT: 19 U/L (ref 0–44)
AST: 19 U/L (ref 15–41)
Alkaline Phosphatase: 56 U/L (ref 38–126)
Anion gap: 8 (ref 5–15)
BUN: 15 mg/dL (ref 6–20)
CHLORIDE: 102 mmol/L (ref 98–111)
CO2: 29 mmol/L (ref 22–32)
Calcium: 9.4 mg/dL (ref 8.9–10.3)
Creatinine, Ser: 1.01 mg/dL (ref 0.61–1.24)
GFR calc Af Amer: >60 mL/min (ref >60)
GFR calc non Af Amer: >60 mL/min (ref >60)
Glucose, Bld: 146 mg/dL — ABNORMAL HIGH (ref 70–99)
POTASSIUM: 3.7 mmol/L (ref 3.5–5.1)
SODIUM: 139 mmol/L (ref 135–145)
Total Bilirubin: 0.6 mg/dL (ref 0.3–1.2)
Total Protein: 7.7 g/dL (ref 6.5–8.1)

## 2017-11-28 NOTE — ED Triage Notes (Signed)
Patient c/o RLQ abdominal pain with nausea progressively worsening x3 weeks. Denies vomiting. - [place] last week.

## 2017-11-29 ENCOUNTER — Other Ambulatory Visit: Payer: Self-pay

## 2017-11-29 ENCOUNTER — Encounter (HOSPITAL_COMMUNITY): Payer: Self-pay

## 2017-11-29 ENCOUNTER — Emergency Department (HOSPITAL_COMMUNITY): Payer: BLUE CROSS/BLUE SHIELD

## 2017-11-29 ENCOUNTER — Emergency Department (HOSPITAL_COMMUNITY)
Admission: EM | Admit: 2017-11-29 | Discharge: 2017-11-29 | Disposition: A | Discharge status: Home / Self Care | Payer: BLUE CROSS/BLUE SHIELD | Attending: Emergency Medicine | Admitting: Emergency Medicine

## 2017-11-29 DIAGNOSIS — R1031 Right lower quadrant pain: Secondary | ICD-10-CM

## 2017-11-29 LAB — URINALYSIS, ROUTINE W REFLEX MICROSCOPIC
BILIRUBIN URINE: NEGATIVE
Glucose, UA: NEGATIVE mg/dL
HGB URINE DIPSTICK: NEGATIVE
KETONES UR: 5 mg/dL — AB
Leukocytes, UA: NEGATIVE
NITRITE: NEGATIVE
PH: 5 (ref 5.0–8.0)
Protein, ur: NEGATIVE mg/dL
Specific Gravity, Urine: >1.046 — ABNORMAL HIGH (ref 1.005–1.030)

## 2017-11-29 MED ORDER — DICYCLOMINE HCL 20 MG PO TABS: 20.0000 mg | ORAL_TABLET | Freq: Two times a day (BID) | ORAL | 0 refills | Status: AC

## 2017-11-29 MED ORDER — IOPAMIDOL (ISOVUE-300) INJECTION 61%
100.0000 mL | Freq: Once | INTRAVENOUS | Status: AC | PRN
  Administered 2017-11-29: 100 mL via INTRAVENOUS

## 2017-11-29 MED ORDER — IOPAMIDOL (ISOVUE-300) INJECTION 61%
INTRAVENOUS | Status: AC
  Filled 2017-11-29: qty 100

## 2017-11-29 MED ORDER — ONDANSETRON 4 MG PO TBDP: 4.0000 mg | ORAL_TABLET | Freq: Three times a day (TID) | ORAL | 0 refills | Status: AC | PRN

## 2017-11-29 MED ORDER — GI COCKTAIL ~~LOC~~
30.0000 mL | Freq: Once | ORAL | Status: AC
  Administered 2017-11-29: 30 mL via ORAL
  Filled 2017-11-29: qty 30

## 2017-11-29 NOTE — ED Notes (Signed)
Patient transported to CT 

## 2017-11-29 NOTE — Discharge Instructions (Addendum)
Your work-up has been very reassuring in the emergency department today.  Your CAT scan showed normal appendix.  Unknown cause of your pain.  Have given you Bentyl to help with cramping pain.  Also give you Zofran for any nausea that she may have.  I would recommend taking Motrin and Tylenol for pain over-the-counter.  Given that your pain is ongoing I suggest you following up with a possible GI doctor return to your primary care doctor.  Have given you a follow-up to the GI office.  Return to the ED if you develop worsening pain, fevers, urinary symptoms or bloody stools.

## 2017-11-29 NOTE — ED Provider Notes (Signed)
[facility] DEPT Provider Note   CSN: [id number] Arrival date & time: 11/28/17  1920     History   Chief Complaint Chief Complaint  Patient presents with  . Abdominal Pain    HPI Quan Cybulski is a 22 y.o. male.  HPI 22 year old male with no pertinent past medical history presents to the ED for evaluation of right lower quadrant abdominal pain ongoing for 3 weeks.  Patient states that the pain is intermittent.  Describes it as sharp and cramping in nature.  The pain radiates to his epigastric region.  Patient states the pain is worse with movement and palpation.  It does not make the pain worse.  Patient denies any testicular pain or swelling.  He does report some low back pain.  Denies any urinary symptoms.  Denies any penile discharge.  Patient reports mild nausea but denies any emesis.  Had ultrasound last week by primary care who saw patient for same symptoms that was normal.  Patient is not taking medications for his symptoms.  Sought urgent care today for same symptoms I sent patient to the ED for possible appendicitis.  Patient reports soft stools but denies any known diarrhea.  Denies any fevers or chills.  No history of same pain.  Pt denies any fever, chill, ha, vision changes, lightheadedness, dizziness, congestion, neck pain, cp, sob, cough, urinary symptoms, change in bowel habits, melena, hematochezia, lower extremity paresthesias.  Past Medical History:  Diagnosis Date  . Disequilibrium     Patient Active Problem List   Diagnosis Date Noted  . Headache 08/16/2015  . Dizziness 08/16/2015    Past Surgical History:  Procedure Laterality Date  . NO PAST SURGERIES          Home Medications    Prior to Admission medications   Medication Sig Start Date End Date Taking? Authorizing Provider  ibuprofen (ADVIL,MOTRIN) 200 MG tablet Take 200-400 mg by mouth every 6 (six) hours as needed for moderate pain.   Yes [provider]  ibuprofen (ADVIL,MOTRIN) 800 MG tablet Take 0.5-1 tablets (400-800 mg total) by mouth 3 (three) times daily. Take with food. Do not take other NSAID pain reliever with this medication. Patient not taking: Reported on 11/29/2017 07/08/16   Tereasa Coop, PA-C    Family History Family History  Problem Relation Age of Onset  . Hyperlipidemia Mother   . Healthy Father     Social History Social History   Tobacco Use  . Smoking status: Former Smoker  Substance Use Topics  . Alcohol use: No  . Drug use: No     Allergies   Patient has no known allergies.   Review of Systems Review of Systems  All other systems reviewed and are negative.    Physical Exam Updated Vital Signs BP (!) 139/97 (BP Location: Left Arm)   Pulse 96   Temp 98.9 F (37.2 C) (Oral)   Resp 18   SpO2 97%   Physical Exam  Constitutional: He is oriented to person, place, and time. He appears well-developed and well-nourished.  Non-toxic appearance. No distress.  HENT:  Head: Normocephalic and atraumatic.  Eyes: Conjunctivae are normal. Right eye exhibits no discharge. Left eye exhibits no discharge.  Neck: Normal range of motion. Neck supple.  Cardiovascular: Normal rate, regular rhythm, normal heart sounds and intact distal pulses. Exam reveals no gallop and no friction rub.  No murmur heard. Pulmonary/Chest: Effort normal and breath sounds normal. No respiratory distress. He exhibits  no tenderness.  Abdominal: Soft. Normal appearance and bowel sounds are normal. He exhibits no distension. There is tenderness in the right lower quadrant and epigastric area. There is no rigidity, no rebound, no guarding, no CVA tenderness, no tenderness at McBurney's point and negative Murphy's sign.  Genitourinary:  Genitourinary Comments: Chaperone present for exam. Circumcised male. No penile discharge, erythema, tenderness, lesion, or rash. 2 descended testes without swelling, pain, lesions or rash. No inguinal  lymphadenopathy or hernia.    Musculoskeletal: Normal range of motion. He exhibits no tenderness.  Lymphadenopathy:    He has no cervical adenopathy.  Neurological: He is alert and oriented to person, place, and time.  Skin: Skin is warm and dry. Capillary refill takes less than 2 seconds. No rash noted.  Psychiatric: His behavior is normal. Judgment and thought content normal.  Nursing note and vitals reviewed.    ED Treatments / Results  Labs (all labs ordered are listed, but only abnormal results are displayed) Labs Reviewed  COMPREHENSIVE METABOLIC PANEL - Abnormal; Notable for the following components:      Result Value   Glucose, Bld 146 (*)    All other components within normal limits  URINALYSIS, ROUTINE W REFLEX MICROSCOPIC - Abnormal; Notable for the following components:   Specific Gravity, Urine >1.046 (*)    Ketones, ur 5 (*)    All other components within normal limits  LIPASE, BLOOD  CBC    EKG None  Radiology No results found.  Procedures Procedures (including critical care time)  Medications Ordered in ED Medications  iopamidol (ISOVUE-300) 61 % injection (has no administration in time range)  iopamidol (ISOVUE-300) 61 % injection 100 mL (100 mLs Intravenous Contrast Given 11/29/17 0109)     Initial Impression / Assessment and Plan / ED Course  I have reviewed the triage vital signs and the nursing notes.  Pertinent labs & imaging results that were available during my care of the patient were reviewed by me and considered in my medical decision making (see chart for details).     Patient is nontoxic, nonseptic appearing, in no apparent distress.  Patient's pain and other symptoms adequately managed in emergency department.  Fluid bolus given.  Labs, imaging and vitals reviewed.  Patient has no leukocytosis.  Normal liver enzymes and kidney function.  UA shows no signs of infection.  Glucose mildly elevated at 146.  Will need rechecked with primary  care.  CT imaging of abdomen reveals no acute findings.  Patient had normal ultrasound last week.  Unknown etiology of patient's symptoms.  Patient does not meet the SIRS or Sepsis criteria.  On repeat exam patient does not have a surgical abdomin and there are no peritoneal signs.  No indication of appendicitis, bowel obstruction, bowel perforation, cholecystitis, diverticulitis. Patient discharged home with symptomatic treatment and given strict instructions for follow-up with their primary care physician and gi.  Patient able to tolerate p.o. fluids in the ED.  Pt is hemodynamically stable, in NAD, & able to ambulate in the ED. Evaluation does not show pathology that would require ongoing emergent intervention or inpatient treatment. I explained the diagnosis to the patient. Pain has been managed & has no complaints prior to dc. Pt is comfortable with above plan and is stable for discharge at this time. All questions were answered prior to disposition. Strict return precautions for f/u to the ED were discussed. Encouraged follow up with PCP.      Final Clinical Impressions(s) / ED Diagnoses  Final diagnoses:  Right lower quadrant abdominal pain    ED Discharge Orders         Ordered    dicyclomine (BENTYL) 20 MG tablet  2 times daily     11/29/17 0310    ondansetron (ZOFRAN ODT) 4 MG disintegrating tablet  Every 8 hours PRN     11/29/17 0310           Doristine Devoid, PA-C 11/29/17 0316    Molpus, Jenny Reichmann, MD 11/29/17 5749

## 2017-12-04 ENCOUNTER — Other Ambulatory Visit: Payer: Self-pay | Admitting: Gastroenterology

## 2017-12-04 ENCOUNTER — Ambulatory Visit
Admission: RE | Admit: 2017-12-04 | Discharge: 2017-12-04 | Disposition: A | Discharge status: Home / Self Care | Payer: BLUE CROSS/BLUE SHIELD | Source: Ambulatory Visit | Attending: Gastroenterology | Admitting: Gastroenterology

## 2017-12-04 DIAGNOSIS — R1011 Right upper quadrant pain: Secondary | ICD-10-CM

## 2017-12-04 DIAGNOSIS — R1031 Right lower quadrant pain: Secondary | ICD-10-CM

## 2017-12-05 ENCOUNTER — Other Ambulatory Visit: Payer: Self-pay | Admitting: Gastroenterology

## 2017-12-20 ENCOUNTER — Ambulatory Visit (HOSPITAL_COMMUNITY): Payer: BLUE CROSS/BLUE SHIELD

## 2020-05-15 IMAGING — CT CT ABD-PELV W/O CM
2 of 4 series · 11 of 46 positions shown, 12 images · non-contrast
Comparison: CT scans 06/05/2014 and 11/29/2017

CLINICAL DATA: Right lower quadrant pain and right back pain for
5-6 weeks.

EXAM:
CT ABDOMEN AND PELVIS WITHOUT CONTRAST
TECHNIQUE: Multidetector CT imaging of the abdomen and pelvis was performed
following the standard protocol without IV contrast.

[Series 2: routine abdomen pelvis without 5.00 br40 s3 ax · axial · non-contrast · 0.46mm/px · z∈[+1175,+1550]mm · 8 of 91 slices shown, 9 images]
[im 8/91  soft-tissue]
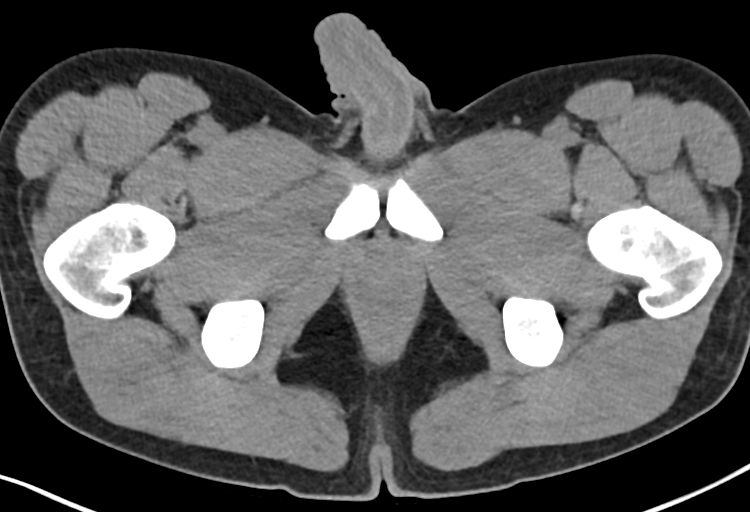
[im 8/91  bone]
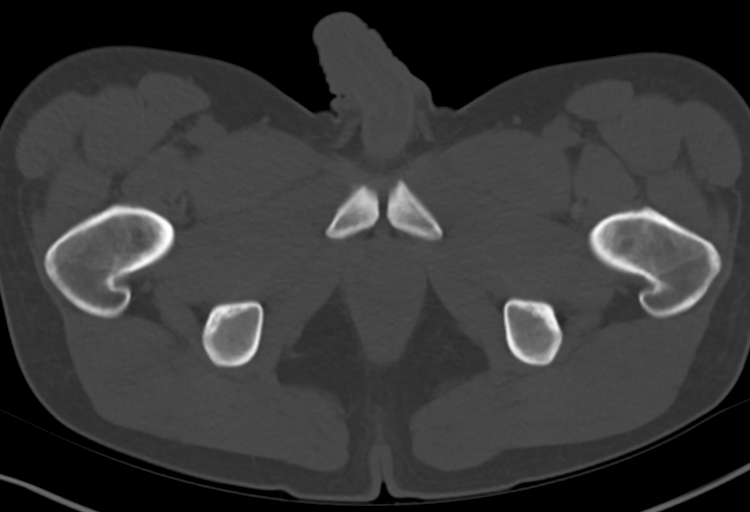
[im 20/91  soft-tissue]
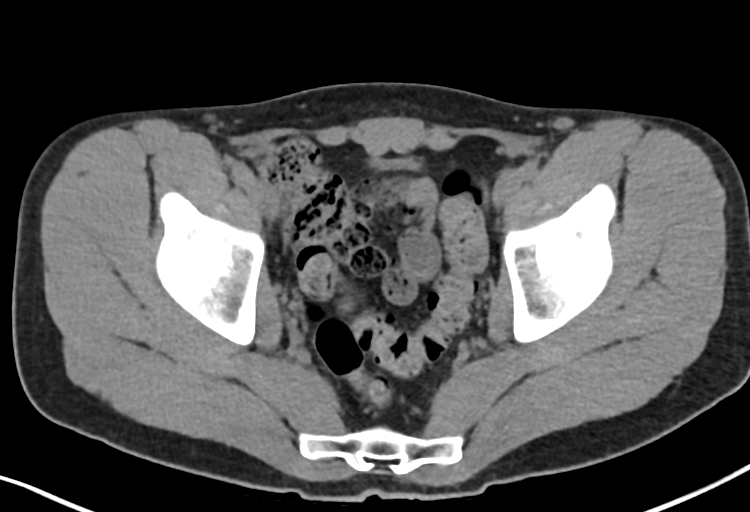
[im 28/91  soft-tissue]
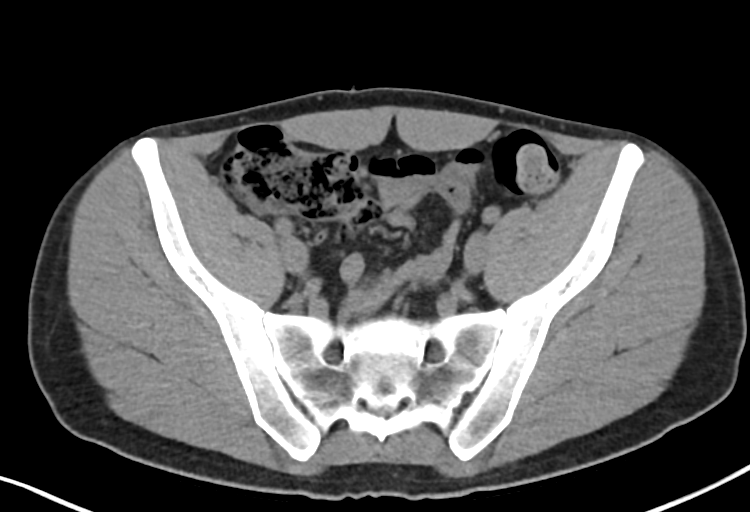
[im 40/91  soft-tissue]
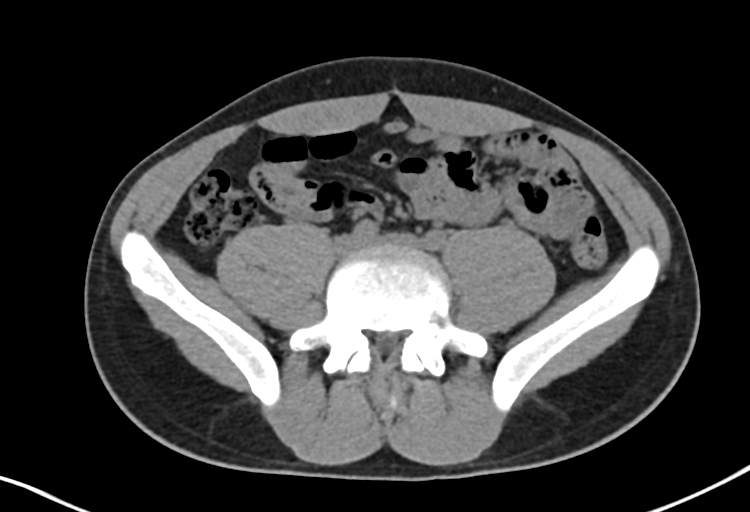
[im 51/91  soft-tissue]
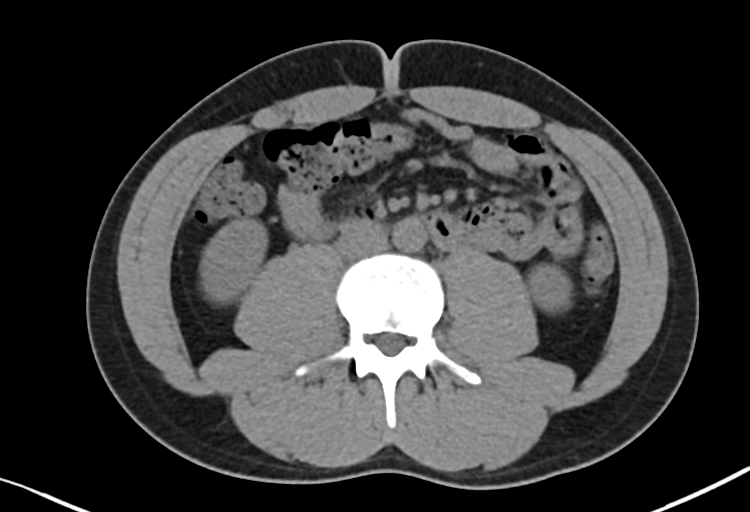
[im 63/91  soft-tissue]
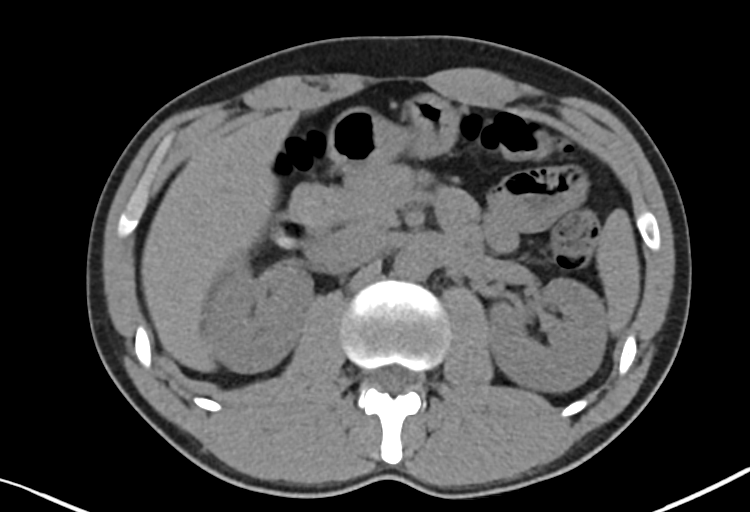
[im 71/91  soft-tissue]
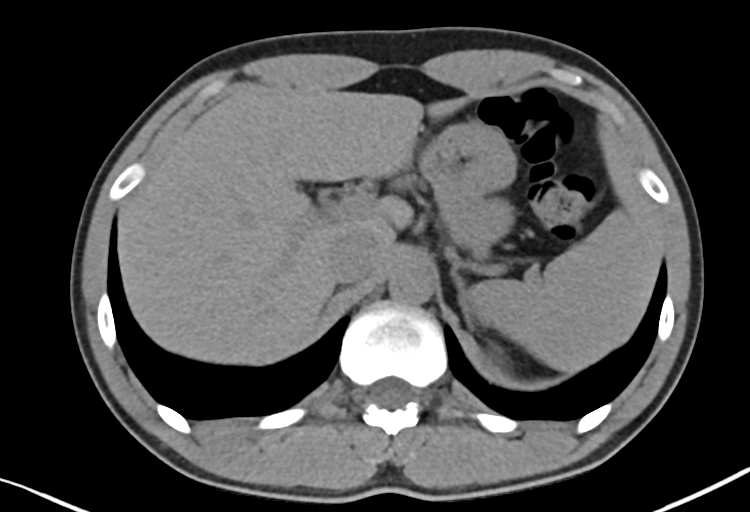
[im 83/91  soft-tissue]
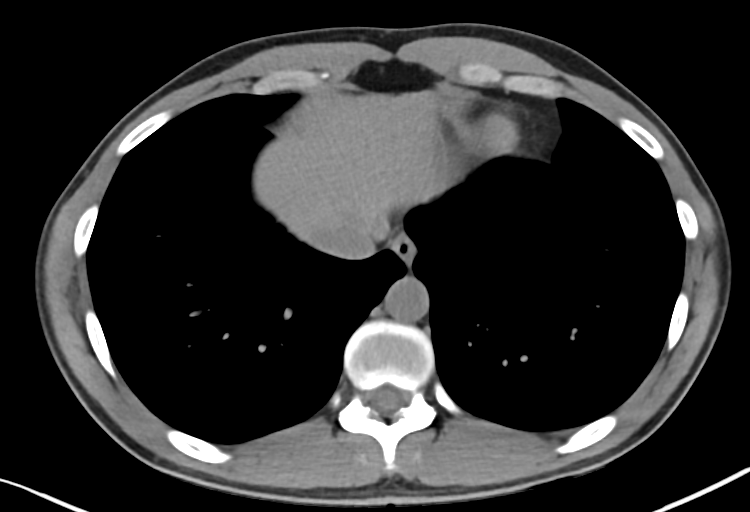

[Series 4: routine abdomen pelvis without 2.00 br40 s3 cor · coronal · non-contrast · 0.68mm/px · 3 of 118 slices shown]
[im 40/118  soft-tissue]
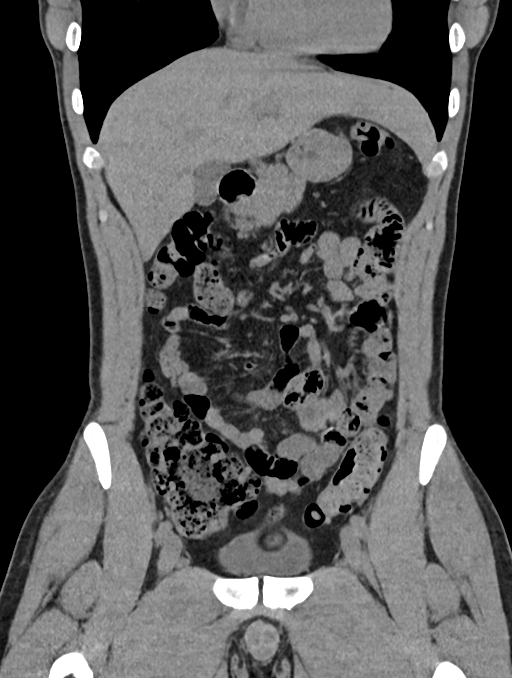
[im 53/118  soft-tissue]
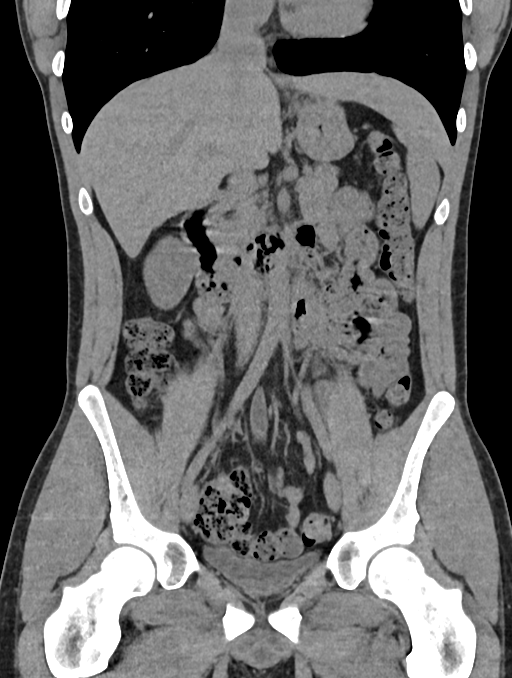
[im 66/118  soft-tissue]
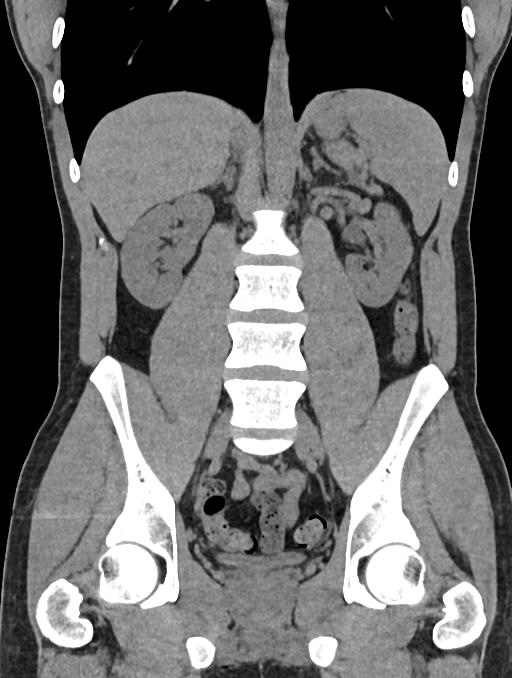

[11 of 46 positions shown; findings below may reference images not displayed]

FINDINGS: Lower chest: The lung bases are clear of acute process. No pleural
effusion or pulmonary lesions. The heart is normal in size. No
pericardial effusion. The distal esophagus and aorta are
unremarkable.

Hepatobiliary: No focal hepatic lesions or intrahepatic biliary
dilatation. The gallbladder is normal. No common bile duct
dilatation.

Pancreas: No mass, inflammation or ductal dilatation.

Spleen: Normal size.  No focal lesions.

Adrenals/Urinary Tract: The adrenal glands and kidneys are
unremarkable. No renal, ureteral or bladder calculi or mass.

Stomach/Bowel: The stomach, duodenum, small bowel and colon are
grossly normal without oral contrast. No inflammatory changes, mass
lesions or obstructive findings. The terminal ileum and appendix are
normal.

Vascular/Lymphatic: The aorta is normal in caliber. No
atheroscerlotic calcifications. No mesenteric of retroperitoneal
mass or adenopathy. Small scattered lymph nodes are noted.

Reproductive: The prostate gland and seminal vesicles are
unremarkable.

Other: No pelvic mass or adenopathy. No free pelvic fluid
collections. No inguinal mass or adenopathy. No abdominal wall
hernia or subcutaneous lesions.

Musculoskeletal: No significant bony findings.
IMPRESSION: 1. No renal, ureteral or bladder calculi or mass without contrast.
2. No acute abdominal/pelvic findings, mass lesions or
lymphadenopathy.
